# Patient Record
Sex: Female | Born: 1965 | Race: White | Hispanic: No | Marital: Married | Smoking: Never smoker
Health system: Southern US, Community
[De-identification: ages and names within clinical notes are randomized; demographics above are authoritative.]

## PROBLEM LIST (undated history)

## (undated) DIAGNOSIS — R740 Nonspecific elevation of levels of transaminase and lactic acid dehydrogenase [LDH]: Secondary | ICD-10-CM

## (undated) DIAGNOSIS — E785 Hyperlipidemia, unspecified: Secondary | ICD-10-CM

## (undated) DIAGNOSIS — I10 Essential (primary) hypertension: Secondary | ICD-10-CM

## (undated) DIAGNOSIS — F329 Major depressive disorder, single episode, unspecified: Secondary | ICD-10-CM

## (undated) DIAGNOSIS — G43009 Migraine without aura, not intractable, without status migrainosus: Secondary | ICD-10-CM

## (undated) DIAGNOSIS — F419 Anxiety disorder, unspecified: Secondary | ICD-10-CM

## (undated) DIAGNOSIS — F988 Other specified behavioral and emotional disorders with onset usually occurring in childhood and adolescence: Secondary | ICD-10-CM

## (undated) HISTORY — DX: Hyperlipidemia, unspecified: E78.5

## (undated) HISTORY — DX: Major depressive disorder, single episode, unspecified: F32.9

## (undated) HISTORY — DX: Nonspecific elevation of levels of transaminase and lactic acid dehydrogenase (ldh): R74.0

## (undated) HISTORY — DX: Essential (primary) hypertension: I10

## (undated) HISTORY — DX: Anxiety disorder, unspecified: F41.9

## (undated) HISTORY — DX: Migraine without aura, not intractable, without status migrainosus: G43.009

## (undated) HISTORY — DX: Other specified behavioral and emotional disorders with onset usually occurring in childhood and adolescence: F98.8

---

## 1993-04-18 DIAGNOSIS — Z9889 Other specified postprocedural states: Secondary | ICD-10-CM

## 1993-04-18 HISTORY — DX: Other specified postprocedural states: Z98.890

## 1993-04-18 HISTORY — PX: BREAST SURGERY: SHX581

## 1998-02-12 ENCOUNTER — Other Ambulatory Visit: Admission: RE | Admit: 1998-02-12 | Discharge: 1998-02-12 | Payer: Self-pay | Admitting: Obstetrics and Gynecology

## 1999-05-25 ENCOUNTER — Other Ambulatory Visit: Admission: RE | Admit: 1999-05-25 | Discharge: 1999-05-25 | Payer: Self-pay | Admitting: Obstetrics and Gynecology

## 2000-03-28 ENCOUNTER — Ambulatory Visit (HOSPITAL_COMMUNITY): Admission: RE | Admit: 2000-03-28 | Discharge: 2000-03-28 | Payer: Self-pay | Admitting: *Deleted

## 2000-03-28 ENCOUNTER — Encounter: Payer: Self-pay | Admitting: *Deleted

## 2000-09-12 ENCOUNTER — Other Ambulatory Visit: Admission: RE | Admit: 2000-09-12 | Discharge: 2000-09-12 | Payer: Self-pay | Admitting: Obstetrics and Gynecology

## 2001-03-29 ENCOUNTER — Emergency Department (HOSPITAL_COMMUNITY): Admission: EM | Admit: 2001-03-29 | Discharge: 2001-03-29 | Payer: Self-pay

## 2002-02-18 ENCOUNTER — Other Ambulatory Visit: Admission: RE | Admit: 2002-02-18 | Discharge: 2002-02-18 | Payer: Self-pay | Admitting: Obstetrics and Gynecology

## 2003-02-11 ENCOUNTER — Other Ambulatory Visit: Admission: RE | Admit: 2003-02-11 | Discharge: 2003-02-11 | Payer: Self-pay | Admitting: Obstetrics and Gynecology

## 2004-03-31 ENCOUNTER — Ambulatory Visit (HOSPITAL_COMMUNITY): Admission: RE | Admit: 2004-03-31 | Discharge: 2004-03-31 | Payer: Self-pay | Admitting: Gastroenterology

## 2005-03-23 ENCOUNTER — Encounter: Admission: RE | Admit: 2005-03-23 | Discharge: 2005-06-21 | Payer: Self-pay | Admitting: Internal Medicine

## 2007-12-21 ENCOUNTER — Ambulatory Visit: Payer: Self-pay | Admitting: Internal Medicine

## 2007-12-21 DIAGNOSIS — F988 Other specified behavioral and emotional disorders with onset usually occurring in childhood and adolescence: Secondary | ICD-10-CM | POA: Insufficient documentation

## 2007-12-21 DIAGNOSIS — F329 Major depressive disorder, single episode, unspecified: Secondary | ICD-10-CM | POA: Insufficient documentation

## 2007-12-21 DIAGNOSIS — F3289 Other specified depressive episodes: Secondary | ICD-10-CM

## 2007-12-21 DIAGNOSIS — I10 Essential (primary) hypertension: Secondary | ICD-10-CM

## 2007-12-21 HISTORY — DX: Major depressive disorder, single episode, unspecified: F32.9

## 2007-12-21 HISTORY — DX: Essential (primary) hypertension: I10

## 2007-12-21 HISTORY — DX: Other specified depressive episodes: F32.89

## 2007-12-21 HISTORY — DX: Other specified behavioral and emotional disorders with onset usually occurring in childhood and adolescence: F98.8

## 2007-12-24 ENCOUNTER — Encounter: Payer: Self-pay | Admitting: Internal Medicine

## 2008-04-25 ENCOUNTER — Telehealth (INDEPENDENT_AMBULATORY_CARE_PROVIDER_SITE_OTHER): Payer: Self-pay | Admitting: *Deleted

## 2008-07-07 ENCOUNTER — Telehealth (INDEPENDENT_AMBULATORY_CARE_PROVIDER_SITE_OTHER): Payer: Self-pay | Admitting: *Deleted

## 2008-07-30 ENCOUNTER — Ambulatory Visit: Payer: Self-pay | Admitting: Internal Medicine

## 2008-07-30 DIAGNOSIS — E785 Hyperlipidemia, unspecified: Secondary | ICD-10-CM | POA: Insufficient documentation

## 2008-07-30 HISTORY — DX: Hyperlipidemia, unspecified: E78.5

## 2008-10-14 ENCOUNTER — Encounter: Payer: Self-pay | Admitting: Internal Medicine

## 2008-10-16 ENCOUNTER — Ambulatory Visit: Payer: Self-pay | Admitting: Internal Medicine

## 2008-12-31 ENCOUNTER — Ambulatory Visit: Payer: Self-pay | Admitting: Internal Medicine

## 2008-12-31 DIAGNOSIS — G43009 Migraine without aura, not intractable, without status migrainosus: Secondary | ICD-10-CM | POA: Insufficient documentation

## 2008-12-31 HISTORY — DX: Migraine without aura, not intractable, without status migrainosus: G43.009

## 2009-05-19 LAB — HM MAMMOGRAPHY: HM Mammogram: NORMAL

## 2009-08-13 ENCOUNTER — Ambulatory Visit: Payer: Self-pay | Admitting: Internal Medicine

## 2009-08-13 DIAGNOSIS — R7401 Elevation of levels of liver transaminase levels: Secondary | ICD-10-CM

## 2009-08-13 DIAGNOSIS — R740 Nonspecific elevation of levels of transaminase and lactic acid dehydrogenase [LDH]: Secondary | ICD-10-CM

## 2009-08-13 HISTORY — DX: Elevation of levels of liver transaminase levels: R74.01

## 2009-08-14 LAB — CONVERTED CEMR LAB
ALT: 97 units/L — ABNORMAL HIGH (ref 0–35)
AST: 81 units/L — ABNORMAL HIGH (ref 0–37)
Albumin: 4 g/dL (ref 3.5–5.2)
Alkaline Phosphatase: 128 units/L — ABNORMAL HIGH (ref 39–117)
BUN: 5 mg/dL — ABNORMAL LOW (ref 6–23)
Basophils Absolute: 0.2 10*3/uL — ABNORMAL HIGH (ref 0.0–0.1)
Basophils Relative: 2.1 % (ref 0.0–3.0)
Bilirubin Urine: NEGATIVE
Bilirubin, Direct: 0.2 mg/dL (ref 0.0–0.3)
CO2: 30 meq/L (ref 19–32)
Calcium: 9.5 mg/dL (ref 8.4–10.5)
Chloride: 105 meq/L (ref 96–112)
Cholesterol: 212 mg/dL — ABNORMAL HIGH (ref 0–200)
Creatinine, Ser: 0.9 mg/dL (ref 0.4–1.2)
Direct LDL: 139 mg/dL
Eosinophils Absolute: 0.3 10*3/uL (ref 0.0–0.7)
Eosinophils Relative: 4.8 % (ref 0.0–5.0)
GFR calc non Af Amer: 71.84 mL/min (ref >60)
Glucose, Bld: 106 mg/dL — ABNORMAL HIGH (ref 70–99)
HCT: 39 % (ref 36.0–46.0)
HCV Ab: NEGATIVE
HDL: 47.5 mg/dL (ref >39.00)
Hemoglobin, Urine: NEGATIVE
Hemoglobin: 13.4 g/dL (ref 12.0–15.0)
Hep A IgM: NEGATIVE
Hep B C IgM: NEGATIVE
Hepatitis B Surface Ag: NEGATIVE
Ketones, ur: NEGATIVE mg/dL
Leukocytes, UA: NEGATIVE
Lymphocytes Relative: 27.8 % (ref 12.0–46.0)
Lymphs Abs: 2 10*3/uL (ref 0.7–4.0)
MCHC: 34.4 g/dL (ref 30.0–36.0)
MCV: 87.9 fL (ref 78.0–100.0)
Monocytes Absolute: 0.5 10*3/uL (ref 0.1–1.0)
Monocytes Relative: 6.9 % (ref 3.0–12.0)
Neutro Abs: 4.2 10*3/uL (ref 1.4–7.7)
Neutrophils Relative %: 58.4 % (ref 43.0–77.0)
Nitrite: NEGATIVE
Platelets: 229 10*3/uL (ref 150.0–400.0)
Potassium: 4.2 meq/L (ref 3.5–5.1)
RBC: 4.43 M/uL (ref 3.87–5.11)
RDW: 12.8 % (ref 11.5–14.6)
Sodium: 142 meq/L (ref 135–145)
Specific Gravity, Urine: 1.01 (ref 1.000–1.030)
TSH: 0.89 microintl units/mL (ref 0.35–5.50)
Total Bilirubin: 0.9 mg/dL (ref 0.3–1.2)
Total CHOL/HDL Ratio: 4
Total Protein, Urine: NEGATIVE mg/dL
Total Protein: 6.8 g/dL (ref 6.0–8.3)
Triglycerides: 157 mg/dL — ABNORMAL HIGH (ref 0.0–149.0)
Urine Glucose: NEGATIVE mg/dL
Urobilinogen, UA: 1 (ref 0.0–1.0)
VLDL: 31.4 mg/dL (ref 0.0–40.0)
WBC: 7.2 10*3/uL (ref 4.5–10.5)
pH: 6 (ref 5.0–8.0)

## 2009-08-19 ENCOUNTER — Encounter: Admission: RE | Admit: 2009-08-19 | Discharge: 2009-08-19 | Payer: Self-pay | Admitting: Internal Medicine

## 2009-09-11 ENCOUNTER — Telehealth: Payer: Self-pay | Admitting: Internal Medicine

## 2009-10-22 ENCOUNTER — Telehealth: Payer: Self-pay | Admitting: Internal Medicine

## 2009-12-16 ENCOUNTER — Telehealth: Payer: Self-pay | Admitting: Internal Medicine

## 2010-01-21 ENCOUNTER — Telehealth: Payer: Self-pay | Admitting: Internal Medicine

## 2010-01-26 ENCOUNTER — Telehealth: Payer: Self-pay | Admitting: Internal Medicine

## 2010-04-30 ENCOUNTER — Telehealth: Payer: Self-pay | Admitting: Internal Medicine

## 2010-05-09 ENCOUNTER — Encounter: Payer: Self-pay | Admitting: Internal Medicine

## 2010-05-20 NOTE — Progress Notes (Signed)
Summary: Adderall  Phone Note Call from Patient Call back at Home Phone [phone]   Caller: Patient Summary of Call: Pt called requesting refill of Adderall Initial call taken by: Margaret Pyle, CMA,  December 16, 2009 9:46 AM  Follow-up for Phone Call        Pt informed Via VM. Rx in cabinet for pt pick up Follow-up by: Margaret Pyle, CMA,  December 16, 2009 1:30 PM    New/Updated Medications: ADDERALL XR 30 MG XR24H-CAP (AMPHETAMINE-DEXTROAMPHETAMINE) 1 by mouth once daily - to fill Dec 16, 2009 Prescriptions: ADDERALL XR 30 MG XR24H-CAP (AMPHETAMINE-DEXTROAMPHETAMINE) 1 by mouth once daily - to fill Dec 16, 2009  #30 x 0   Entered and Authorized by:   Corwin Levins MD   Signed by:   Corwin Levins MD on 12/16/2009   Method used:   Print then Give to Patient   RxID:   [id number]  done hardcopy to LIM side B - dahlia  Corwin Levins MD  December 16, 2009 1:04 PM

## 2010-05-20 NOTE — Progress Notes (Signed)
Summary: Adderall/[facility] pt  Phone Note Call from Patient Call back at Home Phone [phone]   Caller: Patient Summary of Call: Pt called requesting refill of Adderall Initial call taken by: Margaret Pyle, CMA,  April 30, 2010 10:21 AM  Follow-up for Phone Call        irefilled x 1.  i printed Follow-up by: Minus Breeding MD,  April 30, 2010 12:13 PM  Additional Follow-up for Phone Call Additional follow up Details #1::        Pt informed via VM, Rx in cabinet for pt pick up Additional Follow-up by: Margaret Pyle, CMA,  April 30, 2010 1:32 PM    New/Updated Medications: ADDERALL XR 30 MG XR24H-CAP (AMPHETAMINE-DEXTROAMPHETAMINE) 1 by mouth once daily Prescriptions: ADDERALL XR 30 MG XR24H-CAP (AMPHETAMINE-DEXTROAMPHETAMINE) 1 by mouth once daily  #30 x 0   Entered and Authorized by:   Minus Breeding MD   Signed by:   Minus Breeding MD on 04/30/2010   Method used:   Print then Give to Patient   RxID:   [id number]

## 2010-05-20 NOTE — Progress Notes (Signed)
Summary: Adderall  Phone Note Call from Patient Call back at Home Phone [phone] Call back at Work Phone [phone]   Caller: Patient Summary of Call: pt called requesting refill of Adderall XR 30mg  Initial call taken by: Margaret Pyle, CMA,  Sep 11, 2009 8:47 AM  Follow-up for Phone Call        pt informed, Rx in cabinet for pt pick up Follow-up by: Margaret Pyle, CMA,  Sep 11, 2009 9:33 AM    New/Updated Medications: ADDERALL XR 30 MG XR24H-CAP (AMPHETAMINE-DEXTROAMPHETAMINE) 1 by mouth once daily - to fill Sep 12, 2009 Prescriptions: ADDERALL XR 30 MG XR24H-CAP (AMPHETAMINE-DEXTROAMPHETAMINE) 1 by mouth once daily - to fill Sep 12, 2009  #30 x 0   Entered and Authorized by:   Corwin Levins MD   Signed by:   Corwin Levins MD on 09/11/2009   Method used:   Print then Give to Patient   RxID:   [id number]  done hardcopy to LIM side B - dahlia Corwin Levins MD  Sep 11, 2009 9:24 AM

## 2010-05-20 NOTE — Progress Notes (Signed)
Summary: Adderall  Phone Note Call from Patient Call back at Home Phone [phone]   Caller: Patient Summary of Call: Pt called requesting refill of Adderall Initial call taken by: Margaret Pyle, CMA,  October 22, 2009 11:25 AM    New/Updated Medications: ADDERALL XR 30 MG XR24H-CAP (AMPHETAMINE-DEXTROAMPHETAMINE) 1 by mouth once daily - to fill October 22, 2009 Prescriptions: ADDERALL XR 30 MG XR24H-CAP (AMPHETAMINE-DEXTROAMPHETAMINE) 1 by mouth once daily - to fill October 22, 2009  #30 x 0   Entered and Authorized by:   Corwin Levins MD   Signed by:   Corwin Levins MD on 10/22/2009   Method used:   Print then Give to Patient   RxID:   [id number]  done hardcopy to LIM side B - dahlia Corwin Levins MD  October 22, 2009 1:20 PM   Pt informed via VM, Rx in cabinet for pt pick up Margaret Pyle, CMA  October 22, 2009 1:52 PM

## 2010-05-20 NOTE — Progress Notes (Signed)
Summary: Adderall  Phone Note Call from Patient Call back at Home Phone [phone]   Caller: Patient Summary of Call: Pt called requesting refill of Adderall Initial call taken by: Margaret Pyle, CMA,  January 21, 2010 9:29 AM  Follow-up for Phone Call        Pt informed via VM, Rx in cabinet for pt pick up Follow-up by: Margaret Pyle, CMA,  January 21, 2010 1:47 PM    New/Updated Medications: ADDERALL XR 30 MG XR24H-CAP (AMPHETAMINE-DEXTROAMPHETAMINE) 1 by mouth once daily - to fill Jan 21, 2010 Prescriptions: ADDERALL XR 30 MG XR24H-CAP (AMPHETAMINE-DEXTROAMPHETAMINE) 1 by mouth once daily - to fill Jan 21, 2010  #30 x 0   Entered and Authorized by:   Corwin Levins MD   Signed by:   Corwin Levins MD on 01/21/2010   Method used:   Print then Give to Patient   RxID:   [id number]  done hardcopy to LIM side B - dahlia  Corwin Levins MD  January 21, 2010 1:26 PM

## 2010-05-20 NOTE — Miscellaneous (Signed)
Summary: Orders Update  Clinical Lists Changes  Problems: Added new problem of TRANSAMINASES, SERUM, ELEVATED (ICD-790.4) Orders: Added new Referral order of Radiology Referral (Radiology) - Signed 

## 2010-05-20 NOTE — Assessment & Plan Note (Signed)
Summary: follow up to refill rx-lb   Vital Signs:  Patient profile:   45 year old female Height:      61.5 inches Weight:      167.31 pounds BMI:     31.21 O2 Sat:      97 % on Room air Temp:     98.1 degrees F oral Pulse rate:   78 / minute BP sitting:   110 / 72  (left arm) Cuff size:   regular  Vitals Entered ByZella Ball Ewing (August 13, 2009 9:28 AM)  O2 Flow:  Room air  Preventive Care Screening  Mammogram:    Date:  05/19/2009    Next Due:  05/2010    Results:  normal   CC: followup on medication refills/RE   CC:  followup on medication refills/RE.  History of Present Illness: overalld doing well, no complaints., Pt denies CP, sob, doe, wheezing, orthopnea, pnd, worsening LE edema, palps, dizziness or syncope   Pt denies new neuro symptoms such as headache, facial or extremity weakness   Problems Prior to Update: 1)  Transaminases, Serum, Elevated  (ICD-790.4) 2)  Common Migraine  (ICD-346.10) 3)  Preventive Health Care  (ICD-V70.0) 4)  Hyperlipidemia  (ICD-272.4) 5)  Add  (ICD-314.00) 6)  Hypertension  (ICD-401.9) 7)  Family History Breast Cancer 1st Degree Relative <50  (ICD-V16.3) 8)  Depression  (ICD-311)  Medications Prior to Update: 1)  Clonazepam 0.5 Mg Tabs (Clonazepam) .... Take 1 Per Day As Needed 2)  Adderall Xr 30 Mg Xr24h-Cap (Amphetamine-Dextroamphetamine) .Marland Kitchen.. 1 By Mouth Once Daily - To Fill Sept 11, 2010 3)  Wellbutrin Xl 300 Mg Xr24h-Tab (Bupropion Hcl) .Marland Kitchen.. 1 By Mouth Once Daily 4)  Sumatriptan Succinate 100 Mg Tabs (Sumatriptan Succinate) .Marland Kitchen.. 1 By Mouth Every Other Day As Needed  Current Medications (verified): 1)  Clonazepam 0.5 Mg Tabs (Clonazepam) .... Take 1 Per Day As Needed 2)  Adderall Xr 30 Mg Xr24h-Cap (Amphetamine-Dextroamphetamine) .Marland Kitchen.. 1 By Mouth Once Daily - To Fill Aug 13, 2009 3)  Wellbutrin Xl 300 Mg Xr24h-Tab (Bupropion Hcl) .Marland Kitchen.. 1 By Mouth Once Daily 4)  Sumatriptan Succinate 100 Mg Tabs (Sumatriptan Succinate) .Marland Kitchen.. 1 By  Mouth Every Other Day As Needed  Allergies (verified): No Known Drug Allergies  Past History:  Past Medical History: Last updated: 07/30/2008 Depression Hypertension ADD Hyperlipidemia  Past Surgical History: Last updated: 12/21/2007 Denies surgical history  Family History: Last updated: 12/21/2007 Family History of Arthritis -  mother, sister, aunt   Family History Breast cancer - mother, sister, several aunts Family History High cholesterol - father Family History Hypertension - father father with ? 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         all otherwise negative per pt -    Physical Exam  General:  alert and overweight-appearing.   Head:  normocephalic and atraumatic.   Eyes:  vision grossly intact, pupils equal, and pupils round.   Ears:  R ear normal and L ear  normal.   Nose:  no external deformity and no nasal discharge.   Mouth:  no gingival abnormalities and pharynx pink and moist.   Neck:  supple and no masses.   Lungs:  normal respiratory effort and normal breath sounds.   Heart:  normal rate and regular rhythm.   Abdomen:  soft, non-tender, and normal bowel sounds.   Msk:  no joint tenderness and no joint swelling.   Extremities:  no edema, no erythema  Neurologic:  cranial nerves II-XII intact and  strength normal in all extremities.   Skin:  color normal and no rashes.   Psych:  not depressed appearing and moderately anxious.     Impression & Recommendations:  Problem # 1:  Preventive Health Care (ICD-V70.0) Overall doing well, age appropriate education and counseling updated and referral for appropriate preventive services done unless declined, immunizations up to date or declined, diet counseling done if overweight, urged to quit smoking if smokes , most recent labs reviewed and current ordered if appropriate, ecg reviewed or declined (interpretation per ECG scanned in the EMR if done); information regarding Medicare Prevention requirements given if appropriate  Orders: TLB-BMP (Basic Metabolic Panel-BMET) (80048-METABOL) TLB-CBC Platelet - w/Differential (85025-CBCD) TLB-Hepatic/Liver Function Pnl (80076-HEPATIC) TLB-Lipid Panel (80061-LIPID) TLB-TSH (Thyroid Stimulating Hormone) (84443-TSH) TLB-Udip ONLY (81003-UDIP)  Problem # 2:  ADD (ICD-314.00) for med refills  Complete Medication List: 1)  Clonazepam 0.5 Mg Tabs (Clonazepam) .... Take 1 per day as needed 2)  Adderall Xr 30 Mg Xr24h-cap (Amphetamine-dextroamphetamine) .Marland Kitchen.. 1 by mouth once daily - to fill Aug 13, 2009 3)  Wellbutrin Xl 300 Mg Xr24h-tab (Bupropion hcl) .Marland Kitchen.. 1 by mouth once daily 4)  Sumatriptan Succinate 100 Mg Tabs (Sumatriptan succinate) .Marland Kitchen.. 1 by mouth every other day as needed  Patient Instructions: 1)  Continue all previous medications as before this visit  2)  Please go to the Lab in the basement for your blood and/or urine tests today  3)  Please schedule a follow-up appointment in 1 year or sooner if needed Prescriptions: SUMATRIPTAN SUCCINATE 100 MG TABS (SUMATRIPTAN SUCCINATE) 1 by mouth every other day as needed  #9 x 11   Entered and Authorized by:   Corwin Levins MD   Signed by:   Corwin Levins MD on 08/13/2009   Method used:   Print then Give to Patient   RxID:   [id number] ADDERALL  XR 30 MG XR24H-CAP (AMPHETAMINE-DEXTROAMPHETAMINE) 1 by mouth once daily - to fill Aug 13, 2009  #30 x 0   Entered and Authorized by:   Corwin Levins MD   Signed by:   Corwin Levins MD on 08/13/2009   Method used:   Print then Give to Patient   RxID:   [id number] WELLBUTRIN XL 300 MG XR24H-TAB (BUPROPION HCL) 1 by mouth once daily  #90 x 3   Entered and Authorized by:   Corwin Levins MD   Signed by:   Corwin Levins MD on 08/13/2009   Method used:   Print then Give to Patient   RxID:   [id number] CLONAZEPAM 0.5 MG TABS (CLONAZEPAM) Take 1 per day as needed  #30 x 5   Entered and Authorized by:   Corwin Levins MD   Signed by:   Corwin Levins MD on 08/13/2009   Method used:   Print then Give to Patient   RxID:   [id number]

## 2010-05-20 NOTE — Progress Notes (Signed)
Summary: medication refill  Phone Note Refill Request Message from:  Pharmacy on January 26, 2010 1:49 PM  Refills Requested: Medication #1:  CLONAZEPAM 0.5 MG TABS Take 1 per day as needed   Dosage confirmed as above?Dosage Confirmed   Last Refilled: 08/13/2009   Notes: [facility], [phone] Initial call taken by: Zella Ball Ewing CMA Duncan Dull),  January 26, 2010 1:50 PM    Prescriptions: CLONAZEPAM 0.5 MG TABS (CLONAZEPAM) Take 1 per day as needed  #30 x 5   Entered and Authorized by:   Corwin Levins MD   Signed by:   Corwin Levins MD on 01/26/2010   Method used:   Print then Give to Patient   RxID:   [id number]  done hardcopy to LIM side B - dahlia Corwin Levins MD  January 26, 2010 2:04 PM   Rx faxed to pharmacy Margaret Pyle, CMA  January 26, 2010 2:15 PM

## 2010-06-24 ENCOUNTER — Telehealth: Payer: Self-pay | Admitting: Internal Medicine

## 2010-06-29 NOTE — Progress Notes (Signed)
Summary: Adderall  Phone Note Call from Patient Call back at Work Phone [phone]   Caller: Patient Summary of Call: Pt called requesting refill of Adderall. Pt is due for CPX in April 2012 Initial call taken by: Margaret Pyle, CMA,  June 24, 2010 9:03 AM  Follow-up for Phone Call        done hardcopy to LIM side B - dahlia    please help pt make appt for cpx in april if not already done Follow-up by: Corwin Levins MD,  June 24, 2010 9:50 AM  Additional Follow-up for Phone Call Additional follow up Details #1::        Pt advised of Rx and CPX due via VM. Rx in cabinet for pt pick up Additional Follow-up by: Margaret Pyle, CMA,  June 24, 2010 10:01 AM    Prescriptions: ADDERALL XR 30 MG XR24H-CAP (AMPHETAMINE-DEXTROAMPHETAMINE) 1 by mouth once daily  #30 x 0   Entered and Authorized by:   Corwin Levins MD   Signed by:   Corwin Levins MD on 06/24/2010   Method used:   Print then Give to Patient   RxID:   [id number]  done hardcopy to LIM side B - dahlia Corwin Levins MD  June 24, 2010 9:50 AM

## 2010-08-06 ENCOUNTER — Other Ambulatory Visit: Payer: Self-pay

## 2010-08-06 MED ORDER — AMPHETAMINE-DEXTROAMPHET ER 30 MG PO CP24: 30.0000 mg | ORAL_CAPSULE | ORAL | Status: DC

## 2010-08-06 NOTE — Telephone Encounter (Signed)
Pt informed, Rx in cabinet for pt pick up  

## 2010-08-06 NOTE — Telephone Encounter (Signed)
Pt called requesting refill of Adderall, last written 06/24/2010. Pt due for OV this month.

## 2010-09-03 NOTE — Op Note (Signed)
NAMECONCEPCION, GILLOTT             ACCOUNT NO.:  [id number]   MEDICAL RECORD NO.:  [id number]          PATIENT TYPE:  AMB   LOCATION:  ENDO                         FACILITY:  [facility]   PHYSICIAN:  Danise Edge, M.D.   DATE OF BIRTH:  [birth year]   DATE OF PROCEDURE:  03/31/2004  DATE OF DISCHARGE:                                 OPERATIVE REPORT   PROCEDURE:  Screening colonoscopy.   PROCEDURE INDICATION:  Ms. Carrisa Keller is a 45 year old female, born  1966/02/24.  Ms. Newcombe father was diagnosed with colon cancer at  age 54-51.  Ms. Constantine is scheduled to undergo her first screening  colonoscopy with polypectomy to prevent colon cancer.   ENDOSCOPIST:  Danise Edge, M.D.   PREMEDICATION:  1.  Versed 10 mg.  2.  Demerol 100 mg.   DESCRIPTION OF PROCEDURE:  After obtaining informed consent, Ms. Southgate  was placed in the left lateral decubitus position.  I administered  intravenous Demerol and intravenous Versed to achieve conscious sedation for  the procedure.  The patient's blood pressure, oxygen saturation, and cardiac  rhythm were monitored throughout the procedure and documented in the medical  record.   Anal inspection and digital rectal exam were normal.  The Olympus adjustable  pediatric colonoscope was introduced into the rectum and advanced to the  cecum.  Colonic preparation for the exam today was excellent.   RECTUM:  Normal.  SIGMOID COLON AND DESCENDING COLON:  Normal.  SPLENIC FLEXURE:  Normal.  TRANSVERSE COLON:  Normal.  HEPATIC FLEXURE:  Normal.  ASCENDING COLON:  Normal.  CECUM AND ILEOCECAL VALVE:  Normal.   ASSESSMENT:  Normal screening proctocolonoscopy to the cecum.   RECOMMENDATIONS:  Virtual colonoscopy or optical colonoscopy in 5 years.      MJ/MEDQ  D:  03/31/2004  T:  03/31/2004  Job:  [id number]   cc:   Sherry A. Rosalio Macadamia, M.D.  [address]  [place]  [place] [address]  Fax: [phone]   Georgann Housekeeper, MD  [address]  [place]  [place] [address]  Fax: [phone]   Archer Asa, M.D.

## 2010-10-05 ENCOUNTER — Encounter: Payer: Self-pay | Admitting: Internal Medicine

## 2010-10-05 ENCOUNTER — Ambulatory Visit (INDEPENDENT_AMBULATORY_CARE_PROVIDER_SITE_OTHER): Payer: BC Managed Care – PPO | Admitting: Internal Medicine

## 2010-10-05 ENCOUNTER — Other Ambulatory Visit: Payer: Self-pay | Admitting: Internal Medicine

## 2010-10-05 ENCOUNTER — Other Ambulatory Visit (INDEPENDENT_AMBULATORY_CARE_PROVIDER_SITE_OTHER): Payer: BC Managed Care – PPO

## 2010-10-05 VITALS — BP 112/74 | HR 76 | Temp 98.7°F | Ht 62.0 in | Wt 154.5 lb

## 2010-10-05 DIAGNOSIS — Z0000 Encounter for general adult medical examination without abnormal findings: Secondary | ICD-10-CM

## 2010-10-05 DIAGNOSIS — E785 Hyperlipidemia, unspecified: Secondary | ICD-10-CM

## 2010-10-05 DIAGNOSIS — F411 Generalized anxiety disorder: Secondary | ICD-10-CM

## 2010-10-05 DIAGNOSIS — I10 Essential (primary) hypertension: Secondary | ICD-10-CM

## 2010-10-05 DIAGNOSIS — F419 Anxiety disorder, unspecified: Secondary | ICD-10-CM | POA: Insufficient documentation

## 2010-10-05 HISTORY — DX: Anxiety disorder, unspecified: F41.9

## 2010-10-05 LAB — CBC WITH DIFFERENTIAL/PLATELET
Basophils Relative: 0.3 % (ref 0.0–3.0)
Eosinophils Absolute: 0.1 10*3/uL (ref 0.0–0.7)
Eosinophils Relative: 1.7 % (ref 0.0–5.0)
HCT: 41.4 % (ref 36.0–46.0)
Lymphs Abs: 1.9 10*3/uL (ref 0.7–4.0)
MCHC: 34.5 g/dL (ref 30.0–36.0)
MCV: 89.2 fl (ref 78.0–100.0)
Monocytes Absolute: 0.4 10*3/uL (ref 0.1–1.0)
Platelets: 225 10*3/uL (ref 150.0–400.0)
RBC: 4.64 Mil/uL (ref 3.87–5.11)
WBC: 8 10*3/uL (ref 4.5–10.5)

## 2010-10-05 LAB — BASIC METABOLIC PANEL
BUN: 8 mg/dL (ref 6–23)
Chloride: 103 mEq/L (ref 96–112)
GFR: 107.87 mL/min (ref >60.00)
Potassium: 3.9 mEq/L (ref 3.5–5.1)

## 2010-10-05 LAB — LIPID PANEL
Cholesterol: 250 mg/dL — ABNORMAL HIGH (ref 0–200)
HDL: 50.6 mg/dL (ref >39.00)
Triglycerides: 184 mg/dL — ABNORMAL HIGH (ref 0.0–149.0)
VLDL: 36.8 mg/dL (ref 0.0–40.0)

## 2010-10-05 LAB — URINALYSIS, ROUTINE W REFLEX MICROSCOPIC
Bilirubin Urine: NEGATIVE
Ketones, ur: NEGATIVE
Total Protein, Urine: NEGATIVE
pH: 6.5 (ref 5.0–8.0)

## 2010-10-05 LAB — HEPATIC FUNCTION PANEL
AST: 29 U/L (ref 0–37)
Bilirubin, Direct: 0.2 mg/dL (ref 0.0–0.3)
Total Bilirubin: 0.9 mg/dL (ref 0.3–1.2)

## 2010-10-05 MED ORDER — BUPROPION HCL ER (XL) 150 MG PO TB24: 150.0000 mg | ORAL_TABLET | Freq: Every day | ORAL | Status: DC

## 2010-10-05 MED ORDER — AMPHETAMINE-DEXTROAMPHET ER 30 MG PO CP24: 30.0000 mg | ORAL_CAPSULE | Freq: Every day | ORAL | Status: DC

## 2010-10-05 MED ORDER — CLONAZEPAM 0.5 MG PO TABS: 0.5000 mg | ORAL_TABLET | Freq: Two times a day (BID) | ORAL | Status: DC | PRN

## 2010-10-05 MED ORDER — ESCITALOPRAM OXALATE 10 MG PO TABS: 10.0000 mg | ORAL_TABLET | Freq: Every day | ORAL | Status: DC

## 2010-10-05 MED ORDER — SUMATRIPTAN SUCCINATE 100 MG PO TABS: ORAL_TABLET | ORAL | Status: DC

## 2010-10-05 NOTE — Assessment & Plan Note (Signed)
stable overall by hx and exam, most recent data reviewed with pt, and pt to continue medical treatment as before  BP Readings from Last 3 Encounters:  10/05/10 112/74  08/13/09 110/72  12/31/08 102/86

## 2010-10-05 NOTE — Assessment & Plan Note (Signed)
D/w pt, declines statin, for lower chol diet 

## 2010-10-05 NOTE — Assessment & Plan Note (Signed)

## 2010-10-05 NOTE — Assessment & Plan Note (Signed)
Mild uncontrolled, for klonopin bid prn, wean the wellbutrin, and start lexapro 10 qd, consider incr to 20, consider psychiatry referral but declines

## 2010-10-05 NOTE — Patient Instructions (Signed)
Take all new medications as prescribed - the generic lexapro 10 mg per day Decrease the wellbutrin to 150 mg per day for 30 days, then stop Increase the klonopin to twice per day as needed Please call in 4 weeks if you feel you need the 20 mg lexapro for nerves, and depression Please go to LAB in the Basement for the blood and/or urine tests to be done today Please call the phone number [phone] (the [employer] System) for results of testing in 2-3 days;  When calling, simply dial the number, and when prompted enter the MRN number above (the Medical Record Number) and the # key, then the message should start. Please return in 1 year for your yearly visit, or sooner if needed, with Lab testing done 3-5 days before

## 2010-10-05 NOTE — Progress Notes (Signed)
Subjective:    Patient ID: Dawn Andrews, female    DOB: 1966/01/19, 45 y.o.   MRN: [id number]  HPI Here for wellness and f/u;  Overall doing ok;  Pt denies CP, worsening SOB, DOE, wheezing, orthopnea, PND, worsening LE edema, palpitations, dizziness or syncope.  Pt denies neurological change such as new Headache, facial or extremity weakness.  Pt denies polydipsia, polyuria, or low sugar symptoms. Pt states overall good compliance with treatment and medications, good tolerability, and trying to follow lower cholesterol diet.  Pt denies worsening depressive symptoms, suicidal ideation or panic. No fever, wt loss, night sweats, loss of appetite, or other constitutional symptoms.  Pt states good ability with ADL's, low fall risk, home safety reviewed and adequate, no significant changes in hearing or vision, and occasionally active with exercise.  No other new complaints, except ongoing anxiety despite current meds.   Past Medical History  Diagnosis Date  . ADD 12/21/2007  . COMMON MIGRAINE 12/31/2008  . DEPRESSION 12/21/2007  . HYPERLIPIDEMIA 07/30/2008  . HYPERTENSION 12/21/2007  . TRANSAMINASES, SERUM, ELEVATED 08/13/2009   No past surgical history on file.  reports that she has never smoked. She does not have any smokeless tobacco history on file. She reports that she drinks alcohol. Her drug history not on file. family history includes ADD / ADHD in her son; Arthritis in her mother, other, and sister; Cancer in her brother, father, mother, other, and sister; Hyperlipidemia in her father; and Hypertension in her father. No Known Allergies Current Outpatient Prescriptions on File Prior to Visit  Medication Sig Dispense Refill  . DISCONTD: amphetamine-dextroamphetamine (ADDERALL XR) 30 MG 24 hr capsule Take 30 mg by mouth daily.        Marland Kitchen DISCONTD: buPROPion (WELLBUTRIN XL) 300 MG 24 hr tablet Take 300 mg by mouth daily.        Marland Kitchen DISCONTD: clonazePAM (KLONOPIN) 0.5 MG tablet Take 0.5 mg by mouth  daily as needed.        Marland Kitchen DISCONTD: SUMAtriptan (IMITREX) 100 MG tablet Take 100 mg by mouth. 1 by mouth every other day as needed        Review of Systems Review of Systems  Constitutional: Negative for diaphoresis, activity change, appetite change and unexpected weight change.  HENT: Negative for hearing loss, ear pain, facial swelling, mouth sores and neck stiffness.   Eyes: Negative for pain, redness and visual disturbance.  Respiratory: Negative for shortness of breath and wheezing.   Cardiovascular: Negative for chest pain and palpitations.  Gastrointestinal: Negative for diarrhea, blood in stool, abdominal distention and rectal pain.  Genitourinary: Negative for hematuria, flank pain and decreased urine volume.  Musculoskeletal: Negative for myalgias and joint swelling.  Skin: Negative for color change and wound.  Neurological: Negative for syncope and numbness.  Hematological: Negative for adenopathy.  Psychiatric/Behavioral: Negative for hallucinations, self-injury, decreased concentration and agitation.      Objective:   Physical Exam BP 112/74  Pulse 76  Temp(Src) 98.7 F (37.1 C) (Oral)  Ht 5\' 2"  (1.575 m)  Wt 154 lb 8 oz (70.081 kg)  BMI 28.26 kg/m2  SpO2 98%  LMP 07/01/2010 Physical Exam  VS noted Constitutional: Pt is oriented to person, place, and time. Appears well-developed and well-nourished.  HENT:  Head: Normocephalic and atraumatic.  Right Ear: External ear normal.  Left Ear: External ear normal.  Nose: Nose normal.  Mouth/Throat: Oropharynx is clear and moist.  Eyes: Conjunctivae and EOM are normal. Pupils are equal, round, and reactive  to light.  Neck: Normal range of motion. Neck supple. No JVD present. No tracheal deviation present.  Cardiovascular: Normal rate, regular rhythm, normal heart sounds and intact distal pulses.   Pulmonary/Chest: Effort normal and breath sounds normal.  Abdominal: Soft. Bowel sounds are normal. There is no tenderness.    Musculoskeletal: Normal range of motion. Exhibits no edema.  Lymphadenopathy:  Has no cervical adenopathy.  Neurological: Pt is alert and oriented to person, place, and time. Pt has normal reflexes. No cranial nerve deficit.  Skin: Skin is warm and dry. No rash noted.  Psychiatric:  Has  normal mood and affect. Behavior is normal. 1+ nervous        Assessment & Plan:

## 2010-11-19 ENCOUNTER — Other Ambulatory Visit: Payer: Self-pay

## 2010-11-19 MED ORDER — ESCITALOPRAM OXALATE 20 MG PO TABS: 20.0000 mg | ORAL_TABLET | Freq: Every day | ORAL | Status: AC

## 2010-11-19 MED ORDER — AMPHETAMINE-DEXTROAMPHET ER 30 MG PO CP24: 30.0000 mg | ORAL_CAPSULE | Freq: Every day | ORAL | Status: DC

## 2010-11-19 NOTE — Telephone Encounter (Signed)
Pt called requesting refill of Adderall and increase of Lexapro to 20 mg, okay to fill increased medication?

## 2010-11-19 NOTE — Telephone Encounter (Signed)
Pt advised that increased Rx has been approved and sent to pharmacy, Adderall Rx available for pick and is in cabinet upfront.

## 2011-01-03 ENCOUNTER — Other Ambulatory Visit: Payer: Self-pay

## 2011-01-03 MED ORDER — AMPHETAMINE-DEXTROAMPHET ER 30 MG PO CP24: 30.0000 mg | ORAL_CAPSULE | Freq: Every day | ORAL | Status: DC

## 2011-01-03 NOTE — Telephone Encounter (Signed)
Called the patient left message to pickup prescription at front desk.

## 2011-01-03 NOTE — Telephone Encounter (Signed)
Done hardcopy to dahlia/LIM B  

## 2011-02-24 ENCOUNTER — Other Ambulatory Visit: Payer: Self-pay

## 2011-02-24 MED ORDER — AMPHETAMINE-DEXTROAMPHET ER 30 MG PO CP24: 30.0000 mg | ORAL_CAPSULE | Freq: Every day | ORAL | Status: DC

## 2011-02-24 NOTE — Telephone Encounter (Signed)
Done hardcopy to robin  

## 2011-02-24 NOTE — Telephone Encounter (Signed)
Called the patient left message that prescription requested is ready for pickup. Left message on work phone, cell number mailbox was full.

## 2011-05-11 ENCOUNTER — Other Ambulatory Visit: Payer: Self-pay

## 2011-05-11 MED ORDER — AMPHETAMINE-DEXTROAMPHET ER 30 MG PO CP24: 30.0000 mg | ORAL_CAPSULE | Freq: Every day | ORAL | Status: DC

## 2011-05-11 NOTE — Telephone Encounter (Signed)
Pt informed, Rx in cabinet for pt pick up  

## 2011-05-23 ENCOUNTER — Other Ambulatory Visit: Payer: Self-pay

## 2011-05-23 MED ORDER — CLONAZEPAM 0.5 MG PO TABS: 0.5000 mg | ORAL_TABLET | Freq: Two times a day (BID) | ORAL | Status: DC | PRN

## 2011-05-23 NOTE — Telephone Encounter (Signed)
Faxed hardcopy to pharmacy. 

## 2011-05-23 NOTE — Telephone Encounter (Signed)
Done hardcopy to robin  

## 2011-07-19 ENCOUNTER — Other Ambulatory Visit: Payer: Self-pay

## 2011-07-19 MED ORDER — AMPHETAMINE-DEXTROAMPHET ER 30 MG PO CP24: 30.0000 mg | ORAL_CAPSULE | Freq: Every day | ORAL | Status: DC

## 2011-07-19 NOTE — Telephone Encounter (Signed)
Patient called and informed by message that prescription requested is ready for pickup at front desk at convenience.

## 2011-07-19 NOTE — Telephone Encounter (Signed)
Done hardcopy to robin  

## 2011-08-30 ENCOUNTER — Other Ambulatory Visit: Payer: Self-pay

## 2011-08-30 MED ORDER — AMPHETAMINE-DEXTROAMPHET ER 30 MG PO CP24: 30.0000 mg | ORAL_CAPSULE | Freq: Every day | ORAL | Status: DC

## 2011-08-30 NOTE — Telephone Encounter (Signed)
Called left detailed message that prescription requested is ready for pickup at the front desk. 

## 2011-08-30 NOTE — Telephone Encounter (Signed)
Done hardcopy to robin  

## 2011-10-25 ENCOUNTER — Telehealth: Payer: Self-pay | Admitting: Internal Medicine

## 2011-10-25 MED ORDER — AMPHETAMINE-DEXTROAMPHET ER 30 MG PO CP24: 30.0000 mg | ORAL_CAPSULE | ORAL | Status: DC

## 2011-10-25 NOTE — Telephone Encounter (Signed)
Pt calling today 10/25/11 in regards to needs refill for her Adderall.  Says she has to call and leave message each time for her refills.  PLEASE CALL PT BACK AT [phone] WHEN SHE CAN COME BY OFFICE TO PICK THIS UP.

## 2011-10-25 NOTE — Telephone Encounter (Signed)
Called the patient left detailed message that prescription is ready for pickup and informed of MD's instructions on OV.

## 2011-10-25 NOTE — Telephone Encounter (Signed)
Last ov June 2012  Done hardcopy to robin, but needs OV for further refills

## 2011-10-27 ENCOUNTER — Other Ambulatory Visit: Payer: Self-pay

## 2011-10-27 MED ORDER — CLONAZEPAM 0.5 MG PO TABS: 0.5000 mg | ORAL_TABLET | Freq: Two times a day (BID) | ORAL | Status: DC | PRN

## 2011-10-27 NOTE — Telephone Encounter (Signed)
Done hardcopy to robin  

## 2011-10-28 NOTE — Telephone Encounter (Signed)
Faxed hardcopy to pharmacy. 

## 2011-11-01 ENCOUNTER — Ambulatory Visit (INDEPENDENT_AMBULATORY_CARE_PROVIDER_SITE_OTHER): Payer: BC Managed Care – PPO | Admitting: Internal Medicine

## 2011-11-01 ENCOUNTER — Other Ambulatory Visit (INDEPENDENT_AMBULATORY_CARE_PROVIDER_SITE_OTHER): Payer: BC Managed Care – PPO

## 2011-11-01 ENCOUNTER — Encounter: Payer: Self-pay | Admitting: Internal Medicine

## 2011-11-01 VITALS — BP 122/80 | HR 100 | Temp 98.5°F | Ht 62.0 in | Wt 152.5 lb

## 2011-11-01 DIAGNOSIS — R21 Rash and other nonspecific skin eruption: Secondary | ICD-10-CM

## 2011-11-01 DIAGNOSIS — Z0000 Encounter for general adult medical examination without abnormal findings: Secondary | ICD-10-CM

## 2011-11-01 LAB — CBC WITH DIFFERENTIAL/PLATELET
Basophils Absolute: 0.1 10*3/uL (ref 0.0–0.1)
Eosinophils Absolute: 0.8 10*3/uL — ABNORMAL HIGH (ref 0.0–0.7)
Eosinophils Relative: 9.1 % — ABNORMAL HIGH (ref 0.0–5.0)
HCT: 43.4 % (ref 36.0–46.0)
Lymphs Abs: 1.9 10*3/uL (ref 0.7–4.0)
MCHC: 33.8 g/dL (ref 30.0–36.0)
MCV: 88.5 fl (ref 78.0–100.0)
Monocytes Absolute: 0.4 10*3/uL (ref 0.1–1.0)
Platelets: 257 10*3/uL (ref 150.0–400.0)
RDW: 12.8 % (ref 11.5–14.6)

## 2011-11-01 LAB — URINALYSIS, ROUTINE W REFLEX MICROSCOPIC
Bilirubin Urine: NEGATIVE
Hgb urine dipstick: NEGATIVE
Leukocytes, UA: NEGATIVE
Nitrite: NEGATIVE
Total Protein, Urine: NEGATIVE

## 2011-11-01 MED ORDER — TRIAMCINOLONE ACETONIDE 0.1 % EX CREA: TOPICAL_CREAM | Freq: Two times a day (BID) | CUTANEOUS | Status: AC

## 2011-11-01 NOTE — Patient Instructions (Addendum)
Take all new medications as prescribed - the cream Continue all other medications as before Please go to LAB in the Basement for the blood and/or urine tests to be done today You will be contacted by phone if any changes need to be made immediately.  Otherwise, you will receive a letter about your results with an explanation. Please return in 1 year for your yearly visit, or sooner if needed, with Lab testing done 3-5 days before

## 2011-11-01 NOTE — Progress Notes (Signed)
Subjective:    Patient ID: Dawn Andrews, female    DOB: 1965-06-02, 46 y.o.   MRN: [id number]  HPI Here for wellness and f/u;  Overall doing ok;  Pt denies CP, worsening SOB, DOE, wheezing, orthopnea, PND, worsening LE edema, palpitations, dizziness or syncope.  Pt denies neurological change such as new Headache, facial or extremity weakness.  Pt denies polydipsia, polyuria, or low sugar symptoms. Pt states overall good compliance with treatment and medications, good tolerability, and trying to follow lower cholesterol diet.  Pt denies worsening depressive symptoms, suicidal ideation or panic. No fever, wt loss, night sweats, loss of appetite, or other constitutional symptoms.  Pt states good ability with ADL's, low fall risk, home safety reviewed and adequate, no significant changes in hearing or vision, and occasionally active with exercise.  Does have small rash to the arms and legs after working in the yard. Past Medical History  Diagnosis Date  . ADD 12/21/2007  . COMMON MIGRAINE 12/31/2008  . DEPRESSION 12/21/2007  . HYPERLIPIDEMIA 07/30/2008  . HYPERTENSION 12/21/2007  . TRANSAMINASES, SERUM, ELEVATED 08/13/2009  . Anxiety 10/05/2010   No past surgical history on file.  reports that she has never smoked. She does not have any smokeless tobacco history on file. She reports that she drinks alcohol. Her drug history not on file. family history includes ADD / ADHD in her son; Arthritis in her mother, other, and sister; Cancer in her brother, father, mother, other, and sister; Hyperlipidemia in her father; and Hypertension in her father. No Known Allergies Current Outpatient Prescriptions on File Prior to Visit  Medication Sig Dispense Refill  . amphetamine-dextroamphetamine (ADDERALL XR) 30 MG 24 hr capsule Take 1 capsule (30 mg total) by mouth daily.  30 capsule  0  . amphetamine-dextroamphetamine (ADDERALL XR) 30 MG 24 hr capsule Take 1 capsule (30 mg total) by mouth every morning.  30 capsule   0  . clonazePAM (KLONOPIN) 0.5 MG tablet Take 1 tablet (0.5 mg total) by mouth 2 (two) times daily as needed.  60 tablet  0  . atorvastatin (LIPITOR) 20 MG tablet Take 1 tablet (20 mg total) by mouth daily.  90 tablet  3  . buPROPion (WELLBUTRIN XL) 150 MG 24 hr tablet Take 1 tablet (150 mg total) by mouth daily.  30 tablet  0  . SUMAtriptan (IMITREX) 100 MG tablet 1 by mouth every other day as needed  10 tablet  5   Review of Systems Review of Systems  Constitutional: Negative for diaphoresis, activity change, appetite change and unexpected weight change.  HENT: Negative for hearing loss, ear pain, facial swelling, mouth sores and neck stiffness.   Eyes: Negative for pain, redness and visual disturbance.  Respiratory: Negative for shortness of breath and wheezing.   Cardiovascular: Negative for chest pain and palpitations.  Gastrointestinal: Negative for diarrhea, blood in stool, abdominal distention and rectal pain.  Genitourinary: Negative for hematuria, flank pain and decreased urine volume.  Musculoskeletal: Negative for myalgias and joint swelling.  Skin: Negative for color change and wound.  Neurological: Negative for syncope and numbness.  Hematological: Negative for adenopathy.  Psychiatric/Behavioral: Negative for hallucinations, self-injury, decreased concentration and agitation.      Objective:   Physical Exam BP 122/80  Pulse 100  Temp 98.5 F (36.9 C) (Oral)  Ht 5\' 2"  (1.575 m)  Wt 152 lb 8 oz (69.174 kg)  BMI 27.89 kg/m2  SpO2 96% Physical Exam  VS noted Constitutional: Pt is oriented to person, place,  and time. Appears well-developed and well-nourished.  HENT:  Head: Normocephalic and atraumatic.  Right Ear: External ear normal.  Left Ear: External ear normal.  Nose: Nose normal.  Mouth/Throat: Oropharynx is clear and moist.  Eyes: Conjunctivae and EOM are normal. Pupils are equal, round, and reactive to light.  Neck: Normal range of motion. Neck supple. No  JVD present. No tracheal deviation present.  Cardiovascular: Normal rate, regular rhythm, normal heart sounds and intact distal pulses.   Pulmonary/Chest: Effort normal and breath sounds normal.  Abdominal: Soft. Bowel sounds are normal. There is no tenderness.  Musculoskeletal: Normal range of motion. Exhibits no edema.  Lymphadenopathy:  Has no cervical adenopathy.  Neurological: Pt is alert and oriented to person, place, and time. Pt has normal reflexes. No cranial nerve deficit.  Skin: Skin is warm and dry. Small typical contact dermatitis like rash noted right arm and distal leg Psychiatric:  Has  normal mood and affect. Behavior is normal.     Assessment & Plan:

## 2011-11-02 ENCOUNTER — Other Ambulatory Visit: Payer: Self-pay | Admitting: Internal Medicine

## 2011-11-02 ENCOUNTER — Encounter: Payer: Self-pay | Admitting: Internal Medicine

## 2011-11-02 LAB — BASIC METABOLIC PANEL
GFR: 72.07 mL/min (ref >60.00)
Glucose, Bld: 92 mg/dL (ref 70–99)
Potassium: 3.8 mEq/L (ref 3.5–5.1)
Sodium: 137 mEq/L (ref 135–145)

## 2011-11-02 LAB — HEPATIC FUNCTION PANEL
AST: 28 U/L (ref 0–37)
Alkaline Phosphatase: 81 U/L (ref 39–117)
Total Bilirubin: 0.5 mg/dL (ref 0.3–1.2)

## 2011-11-02 LAB — LIPID PANEL: HDL: 48.4 mg/dL (ref >39.00)

## 2011-11-02 LAB — TSH: TSH: 0.97 u[IU]/mL (ref 0.35–5.50)

## 2011-11-02 MED ORDER — ATORVASTATIN CALCIUM 20 MG PO TABS: 20.0000 mg | ORAL_TABLET | Freq: Every day | ORAL | Status: AC

## 2011-11-13 ENCOUNTER — Encounter: Payer: Self-pay | Admitting: Internal Medicine

## 2011-11-13 DIAGNOSIS — R21 Rash and other nonspecific skin eruption: Secondary | ICD-10-CM | POA: Insufficient documentation

## 2011-11-13 NOTE — Assessment & Plan Note (Signed)

## 2011-11-13 NOTE — Assessment & Plan Note (Signed)
C/w contact dermatitis like, for kenalog cr prn,  to f/u any worsening symptoms or concerns

## 2011-11-24 ENCOUNTER — Other Ambulatory Visit: Payer: Self-pay

## 2011-11-24 MED ORDER — AMPHETAMINE-DEXTROAMPHET ER 30 MG PO CP24: 30.0000 mg | ORAL_CAPSULE | ORAL | Status: DC

## 2011-11-24 NOTE — Telephone Encounter (Signed)
Called the patient informed prescription requested is ready for pickup at the front desk. 

## 2011-11-24 NOTE — Telephone Encounter (Signed)
Done hardcopy to robin  

## 2011-12-02 ENCOUNTER — Other Ambulatory Visit: Payer: Self-pay

## 2011-12-02 MED ORDER — CLONAZEPAM 0.5 MG PO TABS: 0.5000 mg | ORAL_TABLET | Freq: Two times a day (BID) | ORAL | Status: DC | PRN

## 2011-12-02 NOTE — Telephone Encounter (Signed)
Faxed hardcopy to pharmacy. 

## 2011-12-02 NOTE — Telephone Encounter (Signed)
Done hardcopy to robin  

## 2012-01-24 ENCOUNTER — Telehealth: Payer: Self-pay | Admitting: Internal Medicine

## 2012-01-24 MED ORDER — AMPHETAMINE-DEXTROAMPHET ER 30 MG PO CP24: 30.0000 mg | ORAL_CAPSULE | ORAL | Status: DC

## 2012-01-24 NOTE — Telephone Encounter (Signed)
The patient called the triage line and is hoping to get a refill of Adderall.  Thanks!

## 2012-01-24 NOTE — Telephone Encounter (Signed)
Done hardcopy to robin  

## 2012-01-25 NOTE — Telephone Encounter (Signed)
Called the patient left detailed message that prescription requested is ready for pickup. 

## 2013-09-03 ENCOUNTER — Encounter: Payer: Self-pay | Admitting: Interventional Cardiology

## 2017-02-06 DIAGNOSIS — R6883 Chills (without fever): Secondary | ICD-10-CM | POA: Diagnosis not present

## 2017-02-06 DIAGNOSIS — H66003 Acute suppurative otitis media without spontaneous rupture of ear drum, bilateral: Secondary | ICD-10-CM | POA: Diagnosis not present

## 2017-02-21 DIAGNOSIS — R945 Abnormal results of liver function studies: Secondary | ICD-10-CM | POA: Diagnosis not present

## 2017-03-07 ENCOUNTER — Other Ambulatory Visit: Payer: Self-pay | Admitting: Internal Medicine

## 2017-03-07 DIAGNOSIS — R7989 Other specified abnormal findings of blood chemistry: Secondary | ICD-10-CM

## 2017-03-07 DIAGNOSIS — R945 Abnormal results of liver function studies: Secondary | ICD-10-CM | POA: Diagnosis not present

## 2017-03-07 DIAGNOSIS — Z23 Encounter for immunization: Secondary | ICD-10-CM | POA: Diagnosis not present

## 2017-03-14 ENCOUNTER — Ambulatory Visit
Admission: RE | Admit: 2017-03-14 | Discharge: 2017-03-14 | Disposition: A | Discharge status: Home / Self Care | Payer: 59 | Source: Ambulatory Visit | Attending: Internal Medicine | Admitting: Internal Medicine

## 2017-03-14 DIAGNOSIS — R7989 Other specified abnormal findings of blood chemistry: Secondary | ICD-10-CM

## 2017-03-14 DIAGNOSIS — K7689 Other specified diseases of liver: Secondary | ICD-10-CM | POA: Diagnosis not present

## 2017-03-14 DIAGNOSIS — R945 Abnormal results of liver function studies: Principal | ICD-10-CM

## 2017-04-04 DIAGNOSIS — Z1231 Encounter for screening mammogram for malignant neoplasm of breast: Secondary | ICD-10-CM | POA: Diagnosis not present

## 2017-04-04 DIAGNOSIS — Z01419 Encounter for gynecological examination (general) (routine) without abnormal findings: Secondary | ICD-10-CM | POA: Diagnosis not present

## 2017-05-08 DIAGNOSIS — Z0000 Encounter for general adult medical examination without abnormal findings: Secondary | ICD-10-CM | POA: Diagnosis not present

## 2017-05-08 DIAGNOSIS — E7800 Pure hypercholesterolemia, unspecified: Secondary | ICD-10-CM | POA: Diagnosis not present

## 2017-06-19 DIAGNOSIS — E782 Mixed hyperlipidemia: Secondary | ICD-10-CM | POA: Diagnosis not present

## 2017-08-23 DIAGNOSIS — Z800 Family history of malignant neoplasm of digestive organs: Secondary | ICD-10-CM | POA: Diagnosis not present

## 2017-11-21 DIAGNOSIS — Z79899 Other long term (current) drug therapy: Secondary | ICD-10-CM | POA: Diagnosis not present

## 2017-12-14 DIAGNOSIS — E782 Mixed hyperlipidemia: Secondary | ICD-10-CM | POA: Diagnosis not present

## 2017-12-14 DIAGNOSIS — I10 Essential (primary) hypertension: Secondary | ICD-10-CM | POA: Diagnosis not present

## 2018-06-15 IMAGING — US US ABDOMEN LIMITED
1 series · 14 of 25 positions shown · non-contrast
Comparison: Ultrasound 08/19/2009.

CLINICAL DATA: Elevated LFTs.

EXAM:
ULTRASOUND ABDOMEN LIMITED RIGHT UPPER QUADRANT

[Series 1: us abdomen limited · 0.23mm/px · 14 of 42 slices shown]
[im 1/42]
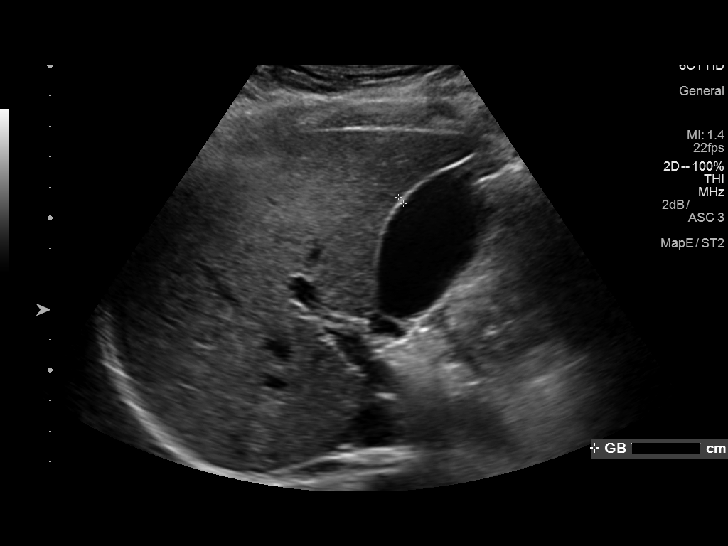
[im 4/42]
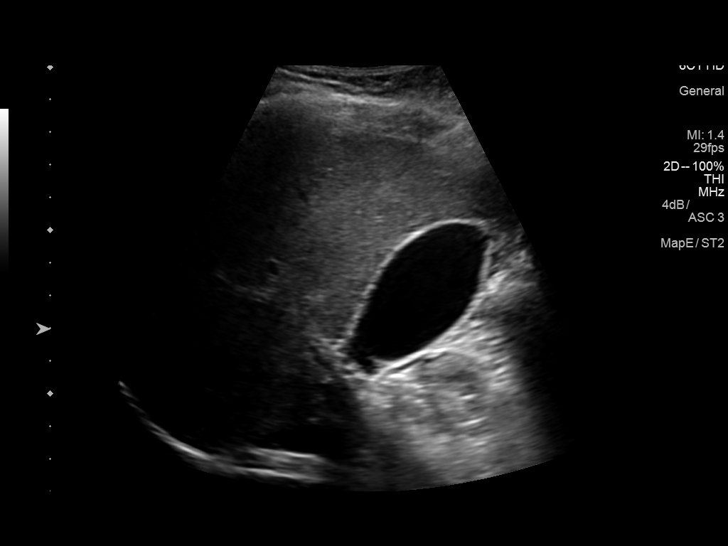
[im 7/42]
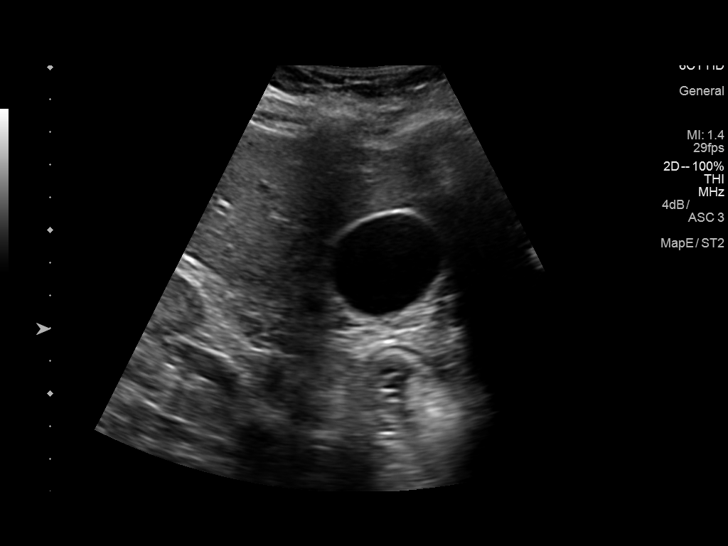
[im 11/42]
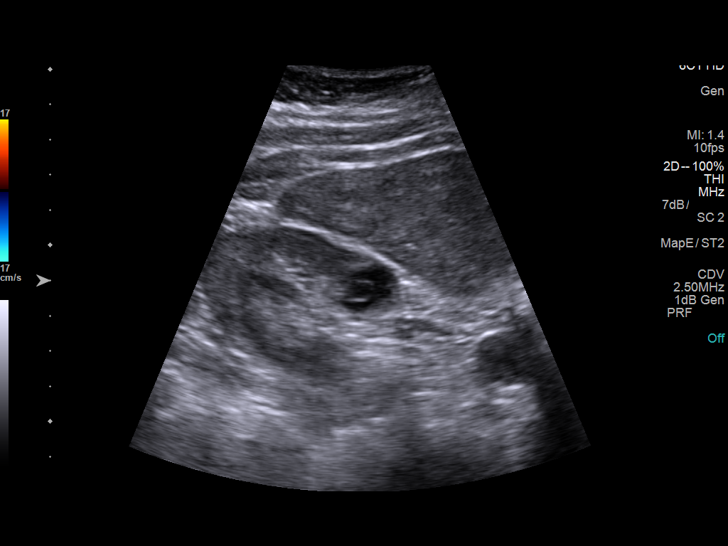
[im 14/42]
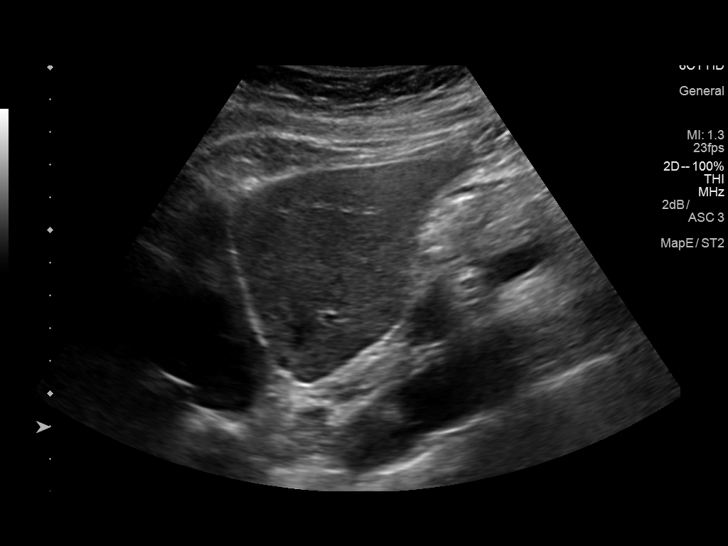
[im 16/42]
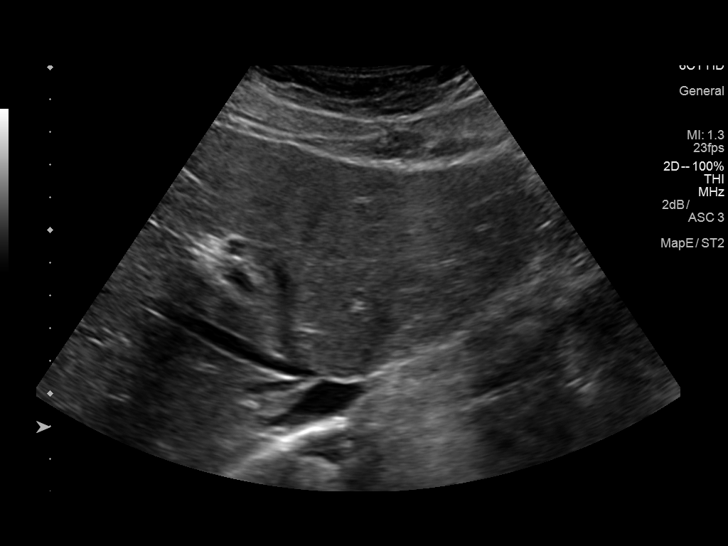
[im 19/42]
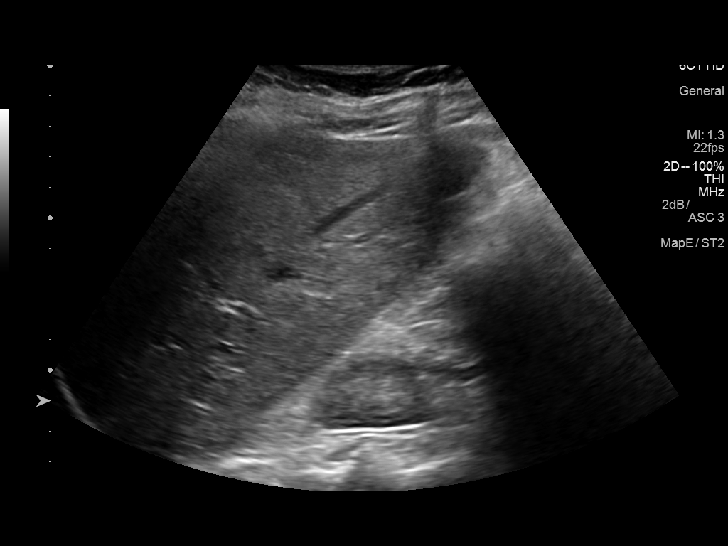
[im 23/42]
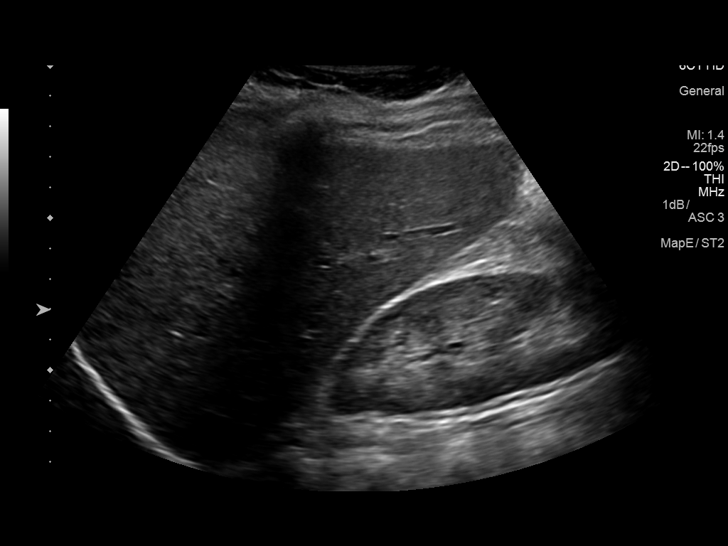
[im 26/42]
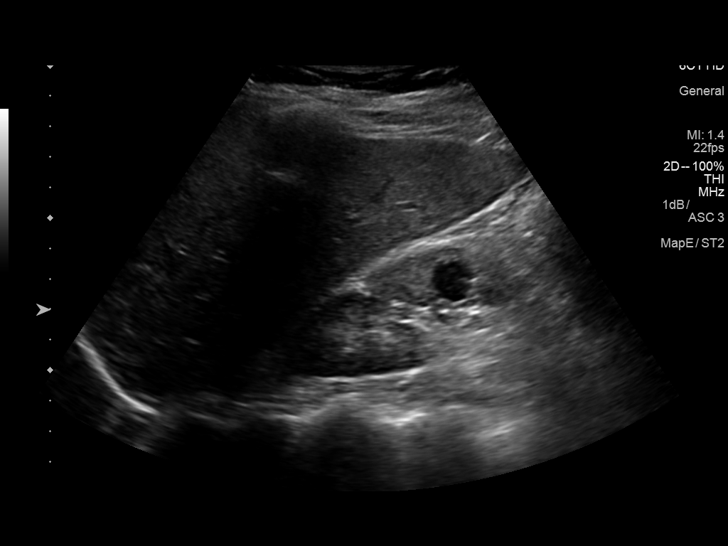
[im 28/42]
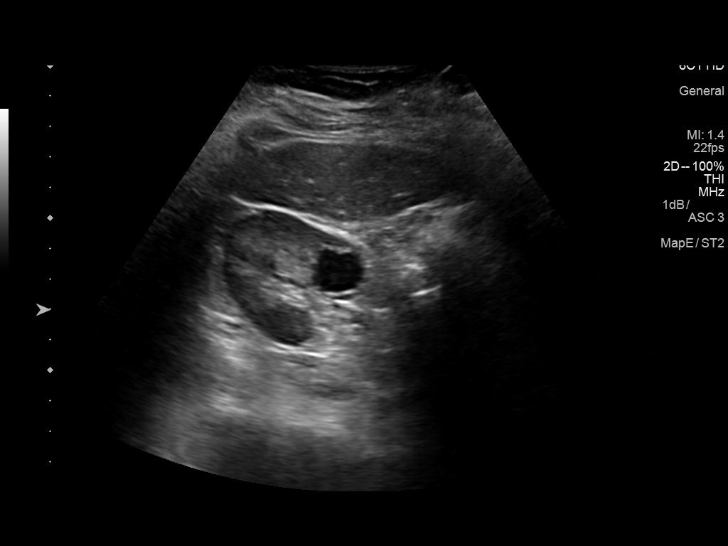
[im 31/42]
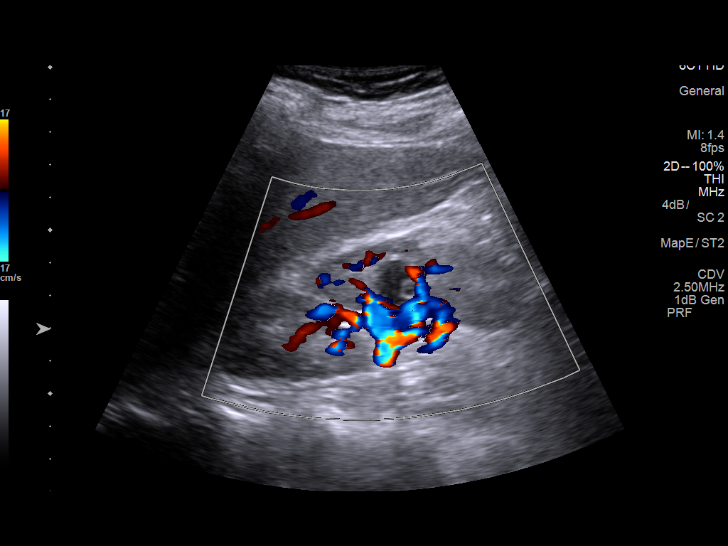
[im 35/42]
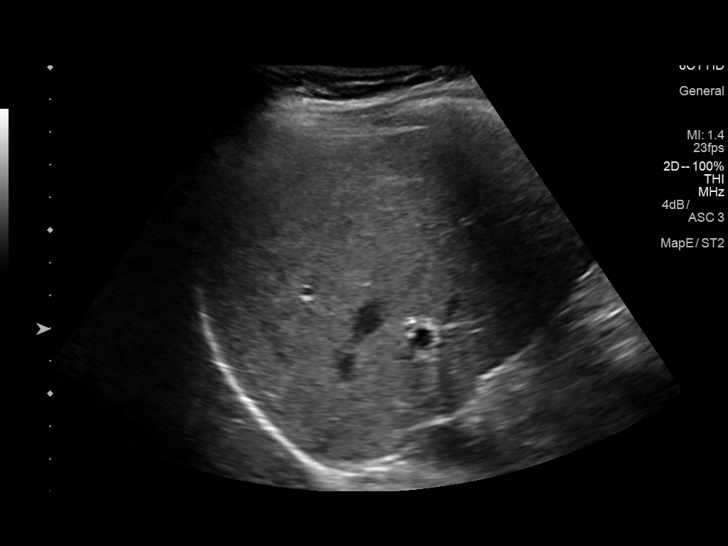
[im 38/42]
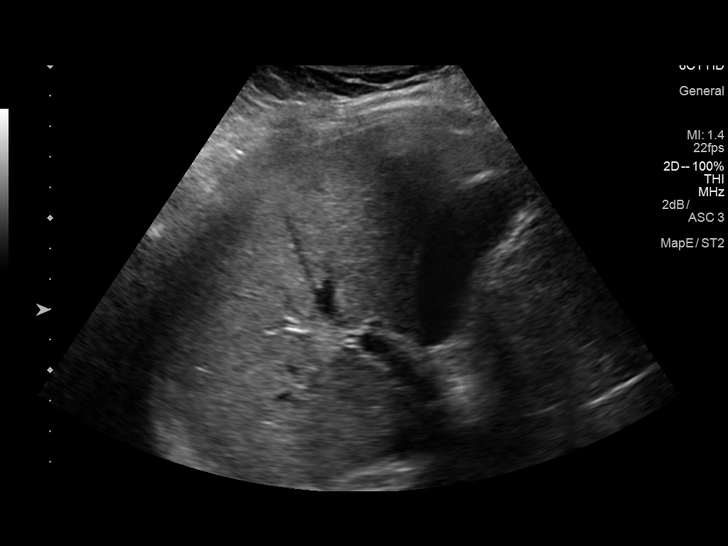
[im 42/42]
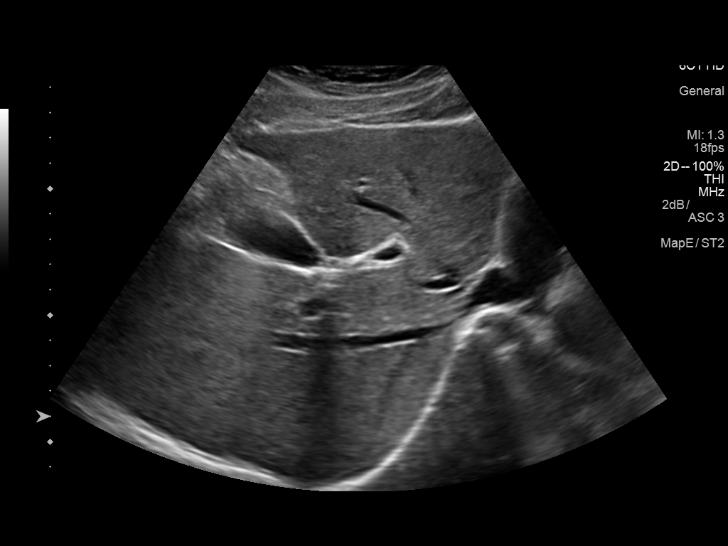

[14 of 25 positions shown; findings below may reference images not displayed]

FINDINGS: Gallbladder:

No gallstones or wall thickening visualized. No sonographic Murphy
sign noted by sonographer.

Common bile duct:

Diameter: 2.3 mm

Liver:

Increased echogenicity consistent fatty infiltration and/or
hepatocellular disease. Portal vein is patent on color Doppler
imaging with normal direction of blood flow towards the liver.

1.4 cm cyst with thin septation noted in the right kidney, this is
most likely benign. Increased echogenicity of the right renal
pyramids cannot be excluded .
IMPRESSION: 1. Increased hepatic echogenicity consistent fatty infiltration
and/or hepatocellular disease.

2. Increased echogenicity right renal pyramids cannot be excluded.
This can be seen from multiple causes including drug therapy and
medullary sponge kidney.

## 2019-05-30 ENCOUNTER — Other Ambulatory Visit: Payer: Self-pay

## 2019-05-30 ENCOUNTER — Ambulatory Visit (INDEPENDENT_AMBULATORY_CARE_PROVIDER_SITE_OTHER): Payer: 59 | Admitting: Psychiatry

## 2019-05-30 ENCOUNTER — Encounter: Payer: Self-pay | Admitting: Psychiatry

## 2019-05-30 DIAGNOSIS — F330 Major depressive disorder, recurrent, mild: Secondary | ICD-10-CM
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
## 2019-06-10 ENCOUNTER — Ambulatory Visit (INDEPENDENT_AMBULATORY_CARE_PROVIDER_SITE_OTHER): Payer: 59 | Admitting: Psychiatry

## 2019-06-10 ENCOUNTER — Other Ambulatory Visit: Payer: Self-pay

## 2019-06-10 ENCOUNTER — Encounter: Payer: Self-pay | Admitting: Psychiatry

## 2019-06-10 DIAGNOSIS — F330 Major depressive disorder, recurrent, mild: Secondary | ICD-10-CM
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
## 2019-06-24 ENCOUNTER — Other Ambulatory Visit: Payer: Self-pay

## 2019-06-24 ENCOUNTER — Ambulatory Visit (INDEPENDENT_AMBULATORY_CARE_PROVIDER_SITE_OTHER): Payer: 59 | Admitting: Psychiatry

## 2019-06-24 ENCOUNTER — Encounter: Payer: Self-pay | Admitting: Psychiatry

## 2019-06-24 DIAGNOSIS — F330 Major depressive disorder, recurrent, mild: Secondary | ICD-10-CM
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
## 2019-07-08 ENCOUNTER — Encounter: Payer: Self-pay | Admitting: Psychiatry

## 2019-07-08 ENCOUNTER — Other Ambulatory Visit: Payer: Self-pay

## 2019-07-08 ENCOUNTER — Ambulatory Visit (INDEPENDENT_AMBULATORY_CARE_PROVIDER_SITE_OTHER): Payer: 59 | Admitting: Psychiatry

## 2019-07-08 DIAGNOSIS — F330 Major depressive disorder, recurrent, mild: Secondary | ICD-10-CM
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
## 2019-07-22 ENCOUNTER — Encounter: Payer: Self-pay | Admitting: Psychiatry

## 2019-07-22 ENCOUNTER — Other Ambulatory Visit: Payer: Self-pay

## 2019-07-22 ENCOUNTER — Ambulatory Visit (INDEPENDENT_AMBULATORY_CARE_PROVIDER_SITE_OTHER): Payer: 59 | Admitting: Psychiatry

## 2019-07-22 DIAGNOSIS — F330 Major depressive disorder, recurrent, mild: Secondary | ICD-10-CM | POA: Diagnosis not present
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
## 2019-08-05 ENCOUNTER — Other Ambulatory Visit: Payer: Self-pay

## 2019-08-05 ENCOUNTER — Encounter: Payer: Self-pay | Admitting: Psychiatry

## 2019-08-05 ENCOUNTER — Ambulatory Visit (INDEPENDENT_AMBULATORY_CARE_PROVIDER_SITE_OTHER): Payer: 59 | Admitting: Psychiatry

## 2019-08-05 DIAGNOSIS — F330 Major depressive disorder, recurrent, mild: Secondary | ICD-10-CM
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
## 2019-08-19 ENCOUNTER — Encounter: Payer: Self-pay | Admitting: Psychiatry

## 2019-08-19 ENCOUNTER — Ambulatory Visit (INDEPENDENT_AMBULATORY_CARE_PROVIDER_SITE_OTHER): Payer: 59 | Admitting: Psychiatry

## 2019-08-19 ENCOUNTER — Other Ambulatory Visit: Payer: Self-pay

## 2019-08-19 DIAGNOSIS — F330 Major depressive disorder, recurrent, mild: Secondary | ICD-10-CM
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
## 2019-09-02 ENCOUNTER — Ambulatory Visit (INDEPENDENT_AMBULATORY_CARE_PROVIDER_SITE_OTHER): Payer: 59 | Admitting: Psychiatry

## 2019-09-02 ENCOUNTER — Encounter: Payer: Self-pay | Admitting: Psychiatry

## 2019-09-02 ENCOUNTER — Other Ambulatory Visit: Payer: Self-pay

## 2019-09-02 DIAGNOSIS — F330 Major depressive disorder, recurrent, mild: Secondary | ICD-10-CM | POA: Diagnosis not present
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
## 2019-09-17 ENCOUNTER — Ambulatory Visit: Payer: 59 | Admitting: Psychiatry

## 2019-10-02 ENCOUNTER — Other Ambulatory Visit: Payer: Self-pay

## 2019-10-02 ENCOUNTER — Ambulatory Visit (INDEPENDENT_AMBULATORY_CARE_PROVIDER_SITE_OTHER): Payer: 59 | Admitting: Psychiatry

## 2019-10-02 ENCOUNTER — Encounter: Payer: Self-pay | Admitting: Psychiatry

## 2019-10-02 DIAGNOSIS — F330 Major depressive disorder, recurrent, mild: Secondary | ICD-10-CM
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
## 2019-10-16 ENCOUNTER — Ambulatory Visit: Payer: 59 | Admitting: Psychiatry

## 2019-10-24 ENCOUNTER — Ambulatory Visit: Payer: 59 | Admitting: Psychiatry

## 2019-11-20 ENCOUNTER — Ambulatory Visit: Payer: 59 | Admitting: Psychiatry

## 2019-12-11 ENCOUNTER — Encounter: Payer: Self-pay | Admitting: Psychiatry

## 2019-12-11 ENCOUNTER — Ambulatory Visit (INDEPENDENT_AMBULATORY_CARE_PROVIDER_SITE_OTHER): Payer: 59 | Admitting: Psychiatry

## 2019-12-11 ENCOUNTER — Other Ambulatory Visit: Payer: Self-pay

## 2019-12-11 DIAGNOSIS — F330 Major depressive disorder, recurrent, mild: Secondary | ICD-10-CM | POA: Diagnosis not present

## 2019-12-11 NOTE — Progress Notes (Signed)
      [facility] Counselor/Therapist Progress Note  Patient ID: Dawn Andrews, MRN: [id number],    Date: 12/11/2019  Time Spent: 50 minutes   Treatment Type: Individual Therapy  Reported Symptoms: anxious, sad  Mental Status Exam:  Appearance:   Well Groomed     Behavior:  Appropriate  Motor:  Normal  Speech/Language:   Clear and Coherent  Affect:  Appropriate  Mood:  anxious and sad  Thought process:  normal  Thought content:    WNL  Sensory/Perceptual disturbances:    WNL  Orientation:  oriented to person, place, time/date and situation  Attention:  Good  Concentration:  Good  Memory:  WNL  Fund of knowledge:   Good  Insight:    Good  Judgment:   Good  Impulse Control:  Good   Risk Assessment: Danger to Self:  No Self-injurious Behavior: No Danger to Others: No Duty to Warn:no Physical Aggression / Violence:No  Access to Firearms a concern: No  Gang Involvement:No   Subjective: "My big dog attacked my Yorkie.  I thought he was killed but he survived."  The client stated that this caused her a lot of anxiety and tears.  Previous to that she states, "I have been crying off and on every day around any minor event."  Today I used eye-movement with the client focusing on her dog since that is the most recent event that caused her to cry and be anxious.  Her negative cognition is, "I will feel powerless or helpless and overwhelmed."  She feels anxiety in her chest.  Her subjective units of distress is a 7.  As the client processed using eye-movement she noticed a decrease in the amount of anxiety she felt.  She talked about her husband telling her that her prayers were "stupid" about her dog.  She also gets treated as if everyone has to protect her.  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  As I continued to process with the client I asked her "can you take care of yourself?"  She replied affirmatively.  I discussed with her her personality style as  an INFJ.  I pointed out that she is very intuitive which she agreed.  I also explained to her that when she ignores her intuition things usually do not go well.  She agreed with that.  I encouraged her to begin to pay attention to her intuition and figure out what it is that.  She agreed.  By the end of the session the client's positive cognition was, "I can trust my judgment.".  Her subjective units of distress was a 0.  Interventions: Assertiveness/Communication, Motivational Interviewing, Solution-Oriented/Positive Psychology, [employer] Desensitization and Reprocessing (EMDR) and Insight-Oriented  Diagnosis:   ICD-10-CM   1. Major depressive disorder, recurrent episode, mild (HCC)  F33.0     Plan: Mood independent behavior, positive self talk, self-care, boundaries, assertiveness.  Gelene Mink Jaskarn Schweer, [facility]

## 2020-01-07 ENCOUNTER — Ambulatory Visit: Payer: 59 | Admitting: Psychiatry

## 2020-02-12 ENCOUNTER — Encounter: Payer: Self-pay | Admitting: Psychiatry

## 2020-02-12 ENCOUNTER — Ambulatory Visit (INDEPENDENT_AMBULATORY_CARE_PROVIDER_SITE_OTHER): Payer: 59 | Admitting: Psychiatry

## 2020-02-12 ENCOUNTER — Other Ambulatory Visit: Payer: Self-pay

## 2020-02-12 DIAGNOSIS — F330 Major depressive disorder, recurrent, mild: Secondary | ICD-10-CM
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
## 2020-02-26 ENCOUNTER — Ambulatory Visit (INDEPENDENT_AMBULATORY_CARE_PROVIDER_SITE_OTHER): Payer: 59 | Admitting: Psychiatry

## 2020-02-26 ENCOUNTER — Other Ambulatory Visit: Payer: Self-pay

## 2020-02-26 ENCOUNTER — Encounter: Payer: Self-pay | Admitting: Psychiatry

## 2020-02-26 DIAGNOSIS — F330 Major depressive disorder, recurrent, mild: Secondary | ICD-10-CM

## 2020-02-26 NOTE — Progress Notes (Signed)
      [facility] Counselor/Therapist Progress Note  Patient ID: Dawn Andrews, MRN: [id number],    Date: 02/26/2020  Time Spent: 50 minutes   Treatment Type: Individual Therapy  Reported Symptoms: anxious, sad, irritated  Mental Status Exam:  Appearance:   Casual     Behavior:  Appropriate  Motor:  Normal  Speech/Language:   Clear and Coherent  Affect:  Appropriate  Mood:  anxious, irritable and sad  Thought process:  normal  Thought content:    WNL  Sensory/Perceptual disturbances:    WNL  Orientation:  oriented to person, place, time/date and situation  Attention:  Good  Concentration:  Good  Memory:  WNL  Fund of knowledge:   Good  Insight:    Good  Judgment:   Good  Impulse Control:  Good   Risk Assessment: Danger to Self:  No Self-injurious Behavior: No Danger to Others: No Duty to Warn:no Physical Aggression / Violence:No  Access to Firearms a concern: No  Gang Involvement:No   Subjective: The client reports that she has done better with her anxiety.  Using the visual with Jesus and her parents has been a helpful strategy for her getting up in the morning.  It only happens about 3 mornings a week but she does still awaken with anxiety.  She has been working more in her office with her husband.  She is not feeling that she is stuck at home unable to work.  Today she was much brighter than she has been in the past.  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  She expressed frustration at how some of their clients take advantage of them by not paying her bill.  Today it seems that it stresses her less but she does seem to be more irritated. She reflected more on what was happening politically in the culture.  She is distressed by the inconsistency in the current economic policy.  She feels that inflation is going to negatively impact so many people this coming winter.  This has caused her to approach things in  a more spiritual manner than she has in the past.  She finds great comfort in it.  She is working towards increasing her exercise.  She is journaling periodically.  She is starting to spend some time with friends.  She feels some of the depression she has had is lifted some.  Use the bilateral stimulation hand paddles with the client as she talked through these issues.  Her anxiety decreased from the subjective units of distress of 5 to less than 3.  Interventions: Assertiveness/Communication, Motivational Interviewing, Solution-Oriented/Positive Psychology, [employer] Desensitization and Reprocessing (EMDR) and Insight-Oriented  Diagnosis:   ICD-10-CM   1. Major depressive disorder, recurrent episode, mild (HCC)  F33.0     Plan: Mood independent behavior, visualization practices, journaling, exercise, positive self talk, assertiveness, boundaries.  Dawn Andrews, [facility]

## 2020-03-11 ENCOUNTER — Encounter: Payer: Self-pay | Admitting: Psychiatry

## 2020-03-11 ENCOUNTER — Ambulatory Visit (INDEPENDENT_AMBULATORY_CARE_PROVIDER_SITE_OTHER): Payer: 59 | Admitting: Psychiatry

## 2020-03-11 ENCOUNTER — Other Ambulatory Visit: Payer: Self-pay

## 2020-03-11 DIAGNOSIS — F330 Major depressive disorder, recurrent, mild: Secondary | ICD-10-CM | POA: Diagnosis not present
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
## 2020-03-17 ENCOUNTER — Encounter: Payer: Self-pay | Admitting: Physician Assistant

## 2020-03-17 ENCOUNTER — Ambulatory Visit (INDEPENDENT_AMBULATORY_CARE_PROVIDER_SITE_OTHER): Payer: 59 | Admitting: Physician Assistant

## 2020-03-17 ENCOUNTER — Other Ambulatory Visit: Payer: Self-pay

## 2020-03-17 VITALS — BP 148/88 | HR 82 | Ht 61.0 in | Wt 140.0 lb

## 2020-03-17 DIAGNOSIS — F331 Major depressive disorder, recurrent, moderate: Secondary | ICD-10-CM

## 2020-03-17 DIAGNOSIS — F429 Obsessive-compulsive disorder, unspecified: Secondary | ICD-10-CM

## 2020-03-17 DIAGNOSIS — F5089 Other specified eating disorder: Secondary | ICD-10-CM

## 2020-03-17 DIAGNOSIS — G4700 Insomnia, unspecified: Secondary | ICD-10-CM

## 2020-03-17 DIAGNOSIS — F411 Generalized anxiety disorder: Secondary | ICD-10-CM

## 2020-03-17 DIAGNOSIS — F4310 Post-traumatic stress disorder, unspecified: Secondary | ICD-10-CM

## 2020-03-17 MED ORDER — ALPRAZOLAM 1 MG PO TABS: 1.0000 mg | ORAL_TABLET | Freq: Two times a day (BID) | ORAL | 1 refills | Status: DC | PRN

## 2020-03-17 MED ORDER — ESZOPICLONE 3 MG PO TABS: 3.0000 mg | ORAL_TABLET | Freq: Every evening | ORAL | 1 refills | Status: DC | PRN

## 2020-03-17 MED ORDER — FLUVOXAMINE MALEATE 100 MG PO TABS: ORAL_TABLET | ORAL | 1 refills | Status: DC

## 2020-03-17 MED ORDER — BUPROPION HCL ER (XL) 300 MG PO TB24: 300.0000 mg | ORAL_TABLET | Freq: Every day | ORAL | 1 refills | Status: DC
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
## 2020-04-23 ENCOUNTER — Ambulatory Visit: Payer: 59 | Admitting: Psychiatry

## 2020-04-30 ENCOUNTER — Encounter: Payer: Self-pay | Admitting: Physician Assistant

## 2020-04-30 ENCOUNTER — Other Ambulatory Visit: Payer: Self-pay

## 2020-04-30 ENCOUNTER — Ambulatory Visit (INDEPENDENT_AMBULATORY_CARE_PROVIDER_SITE_OTHER): Payer: 59 | Admitting: Physician Assistant

## 2020-04-30 DIAGNOSIS — F411 Generalized anxiety disorder: Secondary | ICD-10-CM | POA: Diagnosis not present

## 2020-04-30 DIAGNOSIS — F3341 Major depressive disorder, recurrent, in partial remission: Secondary | ICD-10-CM | POA: Diagnosis not present

## 2020-04-30 DIAGNOSIS — F429 Obsessive-compulsive disorder, unspecified: Secondary | ICD-10-CM | POA: Diagnosis not present

## 2020-04-30 DIAGNOSIS — F4310 Post-traumatic stress disorder, unspecified: Secondary | ICD-10-CM

## 2020-04-30 DIAGNOSIS — G4700 Insomnia, unspecified: Secondary | ICD-10-CM

## 2020-04-30 MED ORDER — FLUVOXAMINE MALEATE 100 MG PO TABS: 100.0000 mg | ORAL_TABLET | Freq: Every day | ORAL | 1 refills | Status: DC

## 2020-04-30 MED ORDER — ALPRAZOLAM 1 MG PO TABS: 1.0000 mg | ORAL_TABLET | Freq: Two times a day (BID) | ORAL | 2 refills | Status: DC | PRN

## 2020-04-30 MED ORDER — ESZOPICLONE 3 MG PO TABS: 3.0000 mg | ORAL_TABLET | Freq: Every evening | ORAL | 2 refills | Status: DC | PRN

## 2020-04-30 NOTE — Progress Notes (Signed)
[facility] Med Check  Patient ID: Dawn Andrews,  MRN: [id number]  PCP: Georgann Housekeeper, MD  Date of Evaluation: 04/30/2020 Time spent:20 minutes  Chief Complaint:  Chief Complaint    Anxiety; Depression; Insomnia      HISTORY/CURRENT STATUS: HPI For routine med check.  At LOV 6 weeks ago, started Luvox. "I feel a lot better! Around 80%!" Obsessive thoughts are much better. Is able to enjoy things more. Energy/movitation are good. Not isolating. Not crying easily. No SI/HI.  Anxiety is much better treated after increasing the dose of Xanax and d/c Ativan. Feels much less anxious, the Xanax helps prevent it too, if she is able to know when the anxiety will be triggered.  Sleeps well with Lunesta. No reports of nightmares.  Patient denies increased energy with decreased need for sleep, no increased talkativeness, no racing thoughts, no impulsivity or risky behaviors, no increased spending, no increased libido, no grandiosity, no increased irritability or anger, no paranoia, and no hallucinations.  Denies dizziness, syncope, seizures, numbness, tingling, tremor, tics, unsteady gait, slurred speech, confusion. Denies muscle or joint pain, stiffness, or dystonia.  Individual Medical History/ Review of Systems: Changes? :No    Past medications for mental health diagnoses include: Trazodone, Restoril, Tranxene, Lithium (only one pill), Tegretol, Ambien, Wellbutrin, Xanax, Ativan, Lunesta, Prazosin  Allergies: Patient has no known allergies.  Current Medications:  Current Outpatient Medications:  .  buPROPion (WELLBUTRIN XL) 300 MG 24 hr tablet, Take 1 tablet (300 mg total) by mouth daily., Disp: 90 tablet, Rfl: 1 .  perindopril (ACEON) 4 MG tablet, Take by mouth., Disp: , Rfl:  .  prazosin (MINIPRESS) 1 MG capsule, Take 1 mg by mouth at bedtime., Disp: , Rfl:  .  rosuvastatin (CRESTOR) 10 MG tablet, Take by mouth., Disp: , Rfl:  .  UNABLE TO FIND, SEED probiotic, Disp: , Rfl:   .  ALPRAZolam (XANAX) 1 MG tablet, Take 1 tablet (1 mg total) by mouth 2 (two) times daily as needed for anxiety., Disp: 60 tablet, Rfl: 2 .  atorvastatin (LIPITOR) 20 MG tablet, Take 1 tablet (20 mg total) by mouth daily., Disp: 90 tablet, Rfl: 3 .  Eszopiclone 3 MG TABS, Take 1 tablet (3 mg total) by mouth at bedtime as needed., Disp: 30 tablet, Rfl: 2 .  fluvoxaMINE (LUVOX) 100 MG tablet, Take 1 tablet (100 mg total) by mouth at bedtime., Disp: 90 tablet, Rfl: 1 Medication Side Effects: none  Family Medical/ Social History: Changes? No  MENTAL HEALTH EXAM:  There were no vitals taken for this visit.There is no height or weight on file to calculate BMI.  General Appearance: Casual, Neat and Well Groomed  Eye Contact:  Good  Speech:  Clear and Coherent and Normal Rate  Volume:  Normal  Mood:  Euthymic  Affect:  Appropriate  Thought Process:  Goal Directed and Descriptions of Associations: Intact  Orientation:  Full (Time, Place, and Person)  Thought Content: Logical   Suicidal Thoughts:  No  Homicidal Thoughts:  No  Memory:  WNL  Judgement:  Good  Insight:  Good  Psychomotor Activity:  Normal  Concentration:  Concentration: Good  Recall:  Good  Fund of Knowledge: Good  Language: Good  Assets:  Desire for Improvement  ADL's:  Intact  Cognition: WNL  Prognosis:  Good    DIAGNOSES:    ICD-10-CM   1. Recurrent major depressive disorder, in partial remission (HCC)  F33.41   2. PTSD (post-traumatic stress disorder)  F43.10  3. Generalized anxiety disorder  F41.1   4. Obsessive-compulsive disorder, unspecified type  F42.9   5. Insomnia, unspecified type  G47.00     Receiving Psychotherapy: Yes With Sherron Monday, [facility]   RECOMMENDATIONS:  PDMP reviewed.  I provided 20 minutes of face to face time during this encounter, discussing her response to the medication.  She is doing great!  No changes in medication at this time. Continue Xanax 1 mg, 1 p.o. twice daily as  needed. Continue Wellbutrin XL 300 mg, 1 p.o. daily. Continue Lunesta 3 mg, 1 p.o. nightly as needed sleep. Continue Luvox 100 mg, 1 p.o. nightly. Continue prazosin 1 mg, 1 p.o. nightly Continue counseling with Sherron Monday, [facility]. Return in 3 months.  [name], PA-C

## 2020-05-07 ENCOUNTER — Ambulatory Visit: Payer: 59 | Admitting: Psychiatry

## 2020-05-21 ENCOUNTER — Ambulatory Visit: Payer: 59 | Admitting: Psychiatry

## 2020-06-04 ENCOUNTER — Other Ambulatory Visit: Payer: Self-pay

## 2020-06-04 ENCOUNTER — Encounter: Payer: Self-pay | Admitting: Psychiatry

## 2020-06-04 ENCOUNTER — Ambulatory Visit (INDEPENDENT_AMBULATORY_CARE_PROVIDER_SITE_OTHER): Payer: 59 | Admitting: Psychiatry

## 2020-06-04 DIAGNOSIS — F330 Major depressive disorder, recurrent, mild: Secondary | ICD-10-CM | POA: Diagnosis not present

## 2020-06-04 NOTE — Progress Notes (Signed)
      [facility] Counselor/Therapist Progress Note  Patient ID: Dawn Andrews, MRN: [id number],    Date: 06/04/2020  Time Spent: 50 minutes   Treatment Type: Individual Therapy  Reported Symptoms: sadness, anxiety  Mental Status Exam:  Appearance:   Casual and Well Groomed     Behavior:  Appropriate  Motor:  Normal  Speech/Language:   Clear and Coherent  Affect:  Appropriate  Mood:  anxious and sad  Thought process:  normal  Thought content:    WNL  Sensory/Perceptual disturbances:    WNL  Orientation:  oriented to person, place, time/date and situation  Attention:  Good  Concentration:  Good  Memory:  WNL  Fund of knowledge:   Good  Insight:    Good  Judgment:   Good  Impulse Control:  Good   Risk Assessment: Danger to Self:  No Self-injurious Behavior: No Danger to Others: No Duty to Warn:no Physical Aggression / Violence:No  Access to Firearms a concern: No  Gang Involvement:No   Subjective: The client has stepped out and 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  It is a full-time position and well paid.  The client finds that she likes it quite a bit.  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  She loves the fact that she is able to work from home.  Her interaction with the people in the firm has been very positive.  Her overall mood has significantly improved.  She states that she has mild episodes of anxiety but has found that mindful prayer has been helpful.  She still finds herself periodically sad but has been able to change her mood by engaging in other activities. The client states when her meds changed to Luvox and Wellbutrin she felt significantly improved.  It is amazing to see how much brighter she is.  Today we discussed what she can do to keep moving in a positive direction.  She is working towards getting back into regular exercise.  She is also decided that she wants to return to church.  She was raised in the  [facility] and has not attended in years.  There is a [employer] between her house and her husband's firm that she wants to attend.  I also connected the client with a local women's Bible study called Bible study fellowship or BSF.  The client states she will follow-up with this because she would like relationships with other women that are likeminded.  Interventions: Assertiveness/Communication, Mindfulness Meditation, Motivational Interviewing, Solution-Oriented/Positive Psychology and Insight-Oriented  Diagnosis:   ICD-10-CM   1. Major depressive disorder, recurrent episode, mild (HCC)  F33.0     Plan: Mindful prayer, mood independent behavior, positive self talk, self-care, assertiveness, boundaries, exercise, Bible study, church attendance.  Gelene Mink Shea Swalley, [facility]

## 2020-06-18 ENCOUNTER — Ambulatory Visit: Payer: 59 | Admitting: Psychiatry

## 2020-07-09 ENCOUNTER — Other Ambulatory Visit: Payer: Self-pay

## 2020-07-09 ENCOUNTER — Encounter: Payer: Self-pay | Admitting: Psychiatry

## 2020-07-09 ENCOUNTER — Ambulatory Visit: Payer: 59 | Admitting: Psychiatry

## 2020-07-09 ENCOUNTER — Ambulatory Visit (INDEPENDENT_AMBULATORY_CARE_PROVIDER_SITE_OTHER): Payer: 59 | Admitting: Psychiatry

## 2020-07-09 DIAGNOSIS — F330 Major depressive disorder, recurrent, mild: Secondary | ICD-10-CM | POA: Diagnosis not present

## 2020-07-09 NOTE — Progress Notes (Signed)
      [facility] Counselor/Therapist Progress Note  Patient ID: Dawn Andrews, MRN: [id number],    Date: 07/09/2020  Time Spent: 45 minutes   Treatment Type: Individual Therapy  Reported Symptoms: mild sadness  Mental Status Exam:  Appearance:   Well Groomed     Behavior:  Appropriate  Motor:  Normal  Speech/Language:   Clear and Coherent  Affect:  Appropriate  Mood:  sad  Thought process:  normal  Thought content:    WNL  Sensory/Perceptual disturbances:    WNL  Orientation:  oriented to person, place, time/date and situation  Attention:  Good  Concentration:  Good  Memory:  WNL  Fund of knowledge:   Good  Insight:    Good  Judgment:   Good  Impulse Control:  Good   Risk Assessment: Danger to Self:  No Self-injurious Behavior: No Danger to Others: No Duty to Warn:no Physical Aggression / Violence:No  Access to Firearms a concern: No  Gang Involvement:No   Subjective: The client's new job is going well.  She finds that working full-time from home is a good fit for her.  She feels that she has good interaction with her bosses and they are responding well to her.  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  Overall she finds that she has very little sadness and almost no anxiety.  She is bright and motivated.  She feels that she is able to get her work done.  In fact sometimes she works longer because she enjoys the work.  This is a huge change for the client.  Today she did not have anything in particular to work on.  I discussed with the client that I would be retiring at the end of April.  The client was disappointed but understood and was supportive.  She will continue to follow-up with Dawn Overly, PA-C.  She feels she has done the work she is needed to do.  If she does need to see someone she will talk with Dawn Andrews.  She will follow-up one more time in April.  Interventions: Motivational Interviewing, Solution-Oriented/Positive Psychology and  Insight-Oriented  Diagnosis:   ICD-10-CM   1. Major depressive disorder, recurrent episode, mild (HCC)  F33.0     Plan: Mood independent behavior, positive self talk, self-care, assertiveness, boundaries.  Dawn Andrews, [facility]

## 2020-08-05 ENCOUNTER — Other Ambulatory Visit: Payer: Self-pay

## 2020-08-05 ENCOUNTER — Encounter: Payer: Self-pay | Admitting: Physician Assistant

## 2020-08-05 ENCOUNTER — Ambulatory Visit (INDEPENDENT_AMBULATORY_CARE_PROVIDER_SITE_OTHER): Payer: 59 | Admitting: Physician Assistant

## 2020-08-05 ENCOUNTER — Other Ambulatory Visit: Payer: Self-pay | Admitting: Physician Assistant

## 2020-08-05 DIAGNOSIS — F411 Generalized anxiety disorder: Secondary | ICD-10-CM | POA: Diagnosis not present

## 2020-08-05 DIAGNOSIS — F4310 Post-traumatic stress disorder, unspecified: Secondary | ICD-10-CM

## 2020-08-05 DIAGNOSIS — G4700 Insomnia, unspecified: Secondary | ICD-10-CM

## 2020-08-05 DIAGNOSIS — F3342 Major depressive disorder, recurrent, in full remission: Secondary | ICD-10-CM | POA: Diagnosis not present

## 2020-08-05 DIAGNOSIS — F429 Obsessive-compulsive disorder, unspecified: Secondary | ICD-10-CM | POA: Diagnosis not present

## 2020-08-05 MED ORDER — FLUVOXAMINE MALEATE 100 MG PO TABS: 100.0000 mg | ORAL_TABLET | Freq: Every day | ORAL | 1 refills | Status: DC

## 2020-08-05 MED ORDER — ESZOPICLONE 3 MG PO TABS: 3.0000 mg | ORAL_TABLET | Freq: Every evening | ORAL | 2 refills | Status: DC | PRN

## 2020-08-05 MED ORDER — BUPROPION HCL ER (XL) 300 MG PO TB24: 300.0000 mg | ORAL_TABLET | Freq: Every day | ORAL | 1 refills | Status: DC

## 2020-08-05 MED ORDER — ALPRAZOLAM 1 MG PO TABS: 1.0000 mg | ORAL_TABLET | Freq: Two times a day (BID) | ORAL | 2 refills | Status: DC | PRN

## 2020-08-05 NOTE — Progress Notes (Signed)
[facility]  Patient ID: Dawn Andrews,  MRN: [id number]  PCP: Georgann Housekeeper, MD  Date of Evaluation: 08/08/2020 Time spent:20 minutes  Chief Complaint:  Chief Complaint    Anxiety; Depression; Follow-up      HISTORY/CURRENT STATUS: HPI for routine 31-month med check.  Dawn Andrews is doing great!  Says she feels better this past 4 months or so, then she has in a very long time.  She is able to enjoy things.  Energy and motivation are good.  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  She does not cry easily.  Does not isolate.  She sleeps well most of the time although she does need Lunesta.  No complaints of nightmares.  Denies suicidal or homicidal thoughts.  She is sad though because Fred May, [facility] is retiring.  He has been her counselor for at least 10 years.  She is very grateful for all of his help but will miss him.  He will be suggesting other therapist who would be a good fit.  Anxiety is very well treated.  The Luvox has greatly helped the ruminating thoughts and even though she has generalized anxiety and does need the Xanax, she is much better than 5 or 6 months ago.  Denies dizziness, syncope, seizures, numbness, tingling, tremor, tics, unsteady gait, slurred speech, confusion.  Denies muscle or joint pain, stiffness, or dystonia.  Individual Medical History/ Review of Systems: Changes? :No   Past medications for mental health diagnoses include: Trazodone, Restoril, Tranxene, Lithium (only one pill), Tegretol, Ambien, Wellbutrin, Xanax, Ativan, Lunesta, Prazosin  Allergies: Patient has no known allergies.  Current Medications:  Current Outpatient Medications:  .  perindopril (ACEON) 4 MG tablet, Take by mouth., Disp: , Rfl:  .  prazosin (MINIPRESS) 1 MG capsule, Take 1 mg by mouth at bedtime., Disp: , Rfl:  .  rosuvastatin (CRESTOR) 10 MG tablet, Take by mouth., Disp: , Rfl:  .  UNABLE TO  FIND, SEED probiotic, Disp: , Rfl:  .  ALPRAZolam (XANAX) 1 MG tablet, Take 1 tablet (1 mg total) by mouth 2 (two) times daily as needed for anxiety., Disp: 60 tablet, Rfl: 2 .  atorvastatin (LIPITOR) 20 MG tablet, Take 1 tablet (20 mg total) by mouth daily., Disp: 90 tablet, Rfl: 3 .  buPROPion (WELLBUTRIN XL) 300 MG 24 hr tablet, Take 1 tablet (300 mg total) by mouth daily., Disp: 90 tablet, Rfl: 1 .  Eszopiclone 3 MG TABS, Take 1 tablet (3 mg total) by mouth at bedtime as needed., Disp: 30 tablet, Rfl: 2 .  fluvoxaMINE (LUVOX) 100 MG tablet, Take 1 tablet (100 mg total) by mouth at bedtime., Disp: 90 tablet, Rfl: 1 Medication Side Effects: none  Family Medical/ Social History: Changes?no  MENTAL HEALTH EXAM:  There were no vitals taken for this visit.There is no height or weight on file to calculate BMI.  General Appearance: Casual, Neat and Well Groomed  Eye Contact:  Good  Speech:  Clear and Coherent and Normal Rate  Volume:  Normal  Mood:  Euthymic  Affect:  Congruent  Thought Process:  Goal Directed and Descriptions of Associations: Intact  Orientation:  Full (Time, Place, and Person)  Thought Content: Logical   Suicidal Thoughts:  No  Homicidal Thoughts:  No  Memory:  WNL  Judgement:  Good  Insight:  Good  Psychomotor Activity:  Normal  Concentration:  Concentration: Good  Recall:  [place] of Knowledge: Good  Language: Good  Assets:  Desire for Improvement  ADL's:  Intact  Cognition: WNL  Prognosis:  Good    DIAGNOSES:    ICD-10-CM   1. Major depression, recurrent, full remission (HCC)  F33.42   2. Obsessive-compulsive disorder, unspecified type  F42.9   3. PTSD (post-traumatic stress disorder)  F43.10   4. Generalized anxiety disorder  F41.1   5. Insomnia, unspecified type  G47.00     Receiving Psychotherapy: Yes with Sherron Monday, [facility]   RECOMMENDATIONS:  PDMP reviewed. I provided 20 minutes of face-to-face time during this encounter, including time spent  before and after the visit and records review and charting. She is doing great so no medication changes are necessary. I know it will be difficult to change counselors after so many years. I am sure Merlyn Albert will recommend someone that will be able to continue where he and [place] have left off. Continue Xanax 1 mg, 1 p.o. twice daily as needed. Continue Wellbutrin XL 300 mg, 1 p.o. daily. Continue Lunesta 3 mg, 1 p.o. nightly as needed sleep. Continue Luvox 100 mg, 1 p.o. nightly. Continue prazosin 1 mg, 1 p.o. nightly. She will get established with a new therapist soon. Return in 3 months.  [name], PA-C

## 2020-08-06 ENCOUNTER — Ambulatory Visit (INDEPENDENT_AMBULATORY_CARE_PROVIDER_SITE_OTHER): Payer: 59 | Admitting: Psychiatry

## 2020-08-06 ENCOUNTER — Encounter: Payer: Self-pay | Admitting: Psychiatry

## 2020-08-06 DIAGNOSIS — F330 Major depressive disorder, recurrent, mild: Secondary | ICD-10-CM

## 2020-08-06 NOTE — Progress Notes (Signed)
      [facility] Counselor/Therapist Progress Note  Patient ID: Dawn Andrews, MRN: [id number],    Date: 08/06/2020  Time Spent: 45 minutes   Treatment Type: Individual Therapy  Reported Symptoms: sad  Mental Status Exam:  Appearance:   Well Groomed     Behavior:  Appropriate  Motor:  Normal  Speech/Language:   Clear and Coherent  Affect:  Appropriate  Mood:  sad  Thought process:  normal  Thought content:    WNL  Sensory/Perceptual disturbances:    WNL  Orientation:  oriented to person, place, time/date and situation  Attention:  Good  Concentration:  Good  Memory:  WNL  Fund of knowledge:   Good  Insight:    Good  Judgment:   Good  Impulse Control:  Good   Risk Assessment: Danger to Self:  No Self-injurious Behavior: No Danger to Others: No Duty to Warn:no Physical Aggression / Violence:No  Access to Firearms a concern: No  Gang Involvement:No   Subjective: The client feels she is in a good spot to terminate treatment.  She has gotten past quite a bit of her anxiety.  Her medications seem to be working well.  The client has a good connection with [name] PA.  I gave the client some resources to be able to do EMDR for herself.  She will be reading Dr. Cyndia Bent Shapiro's book, "getting past your past".  She will also purchase the bilateral stimulation wristbands from [email or web address].  I encouraged the client to journal as she does the work.  She states she likes to journal so that will be no problem. 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  She states she gets paid well and has been very happy with her interaction with her supervisors.  The client has opened up to a new spiritual life for herself which she has found refreshing.  She states she is doing well in her relationship with her husband.  Her son is getting ready to move to [place], [place] to take on a position at a CPA firm there.  The client agrees she has  done good work and services ended by mutual consent.  Interventions: Assertiveness/Communication, Motivational Interviewing, Solution-Oriented/Positive Psychology and Insight-Oriented  Diagnosis:   ICD-10-CM   1. Major depressive disorder, recurrent episode, mild (HCC)  F33.0     Plan: Dale Coldiron book getting past your past, bilateral stimulation wristbands, positive self talk, self-care, exercise, assertiveness, boundaries.  Gelene Mink Yzabella Crunk, [facility]

## 2020-08-27 ENCOUNTER — Ambulatory Visit: Payer: 59 | Admitting: Psychiatry

## 2020-09-17 ENCOUNTER — Ambulatory Visit: Payer: 59 | Admitting: Psychiatry

## 2020-10-08 ENCOUNTER — Ambulatory Visit: Payer: 59 | Admitting: Psychiatry

## 2020-11-03 ENCOUNTER — Other Ambulatory Visit: Payer: Self-pay | Admitting: Physician Assistant

## 2020-11-04 ENCOUNTER — Encounter: Payer: Self-pay | Admitting: Physician Assistant

## 2020-11-04 ENCOUNTER — Ambulatory Visit (INDEPENDENT_AMBULATORY_CARE_PROVIDER_SITE_OTHER): Payer: 59 | Admitting: Physician Assistant

## 2020-11-04 ENCOUNTER — Other Ambulatory Visit: Payer: Self-pay

## 2020-11-04 VITALS — BP 124/87 | HR 75

## 2020-11-04 DIAGNOSIS — F515 Nightmare disorder: Secondary | ICD-10-CM

## 2020-11-04 DIAGNOSIS — F4310 Post-traumatic stress disorder, unspecified: Secondary | ICD-10-CM | POA: Diagnosis not present

## 2020-11-04 DIAGNOSIS — F4312 Post-traumatic stress disorder, chronic: Secondary | ICD-10-CM

## 2020-11-04 DIAGNOSIS — G4700 Insomnia, unspecified: Secondary | ICD-10-CM

## 2020-11-04 DIAGNOSIS — F3342 Major depressive disorder, recurrent, in full remission: Secondary | ICD-10-CM

## 2020-11-04 DIAGNOSIS — F429 Obsessive-compulsive disorder, unspecified: Secondary | ICD-10-CM

## 2020-11-04 DIAGNOSIS — F411 Generalized anxiety disorder: Secondary | ICD-10-CM

## 2020-11-04 MED ORDER — ESZOPICLONE 3 MG PO TABS: 3.0000 mg | ORAL_TABLET | Freq: Every evening | ORAL | 2 refills | Status: DC | PRN

## 2020-11-04 MED ORDER — PRAZOSIN HCL 1 MG PO CAPS: 1.0000 mg | ORAL_CAPSULE | Freq: Every day | ORAL | 0 refills | Status: DC

## 2020-11-04 MED ORDER — ALPRAZOLAM 1 MG PO TABS: 1.0000 mg | ORAL_TABLET | Freq: Two times a day (BID) | ORAL | 2 refills | Status: DC | PRN

## 2020-11-04 NOTE — Progress Notes (Signed)
[facility]  Patient ID: Dawn Andrews,  MRN: [id number]  PCP: Georgann Housekeeper, MD  Date of Evaluation: 11/04/2020 Time spent:30 minutes  Chief Complaint:  Chief Complaint   Anxiety; Depression     HISTORY/CURRENT STATUS: HPI for routine 58-month med check.  Has been out of prazosin for about 2 months now. Nightmares have recurred, scary, like someone is getting her.  She does get enough rest most of the time though.  Uses Lunesta to help.  Patient denies loss of interest in usual activities and is able to enjoy things.  Denies decreased energy or motivation.  Appetite has not changed.  No extreme sadness, tearfulness, or feelings of hopelessness.  Denies any changes in concentration, making decisions or remembering things.  Denies suicidal or homicidal thoughts.  Anxiety is well controlled.  Does use the Xanax when needed and it is still effective.  Has started a new job so that causes some anxiety but is situational.  Denies dizziness, syncope, seizures, numbness, tingling, tremor, tics, unsteady gait, slurred speech, confusion.  Denies muscle or joint pain, stiffness, or dystonia.  Individual Medical History/ Review of Systems: Changes? :No   Past medications for mental health diagnoses include: Trazodone, Restoril, Tranxene, Lithium (only one pill), Tegretol, Ambien, Wellbutrin, Xanax, Ativan, Lunesta, Prazosin  Allergies: Patient has no known allergies.  Current Medications:  Current Outpatient Medications:    perindopril (ACEON) 4 MG tablet, Take by mouth., Disp: , Rfl:    rosuvastatin (CRESTOR) 10 MG tablet, Take by mouth., Disp: , Rfl:    UNABLE TO FIND, SEED probiotic, Disp: , Rfl:    ALPRAZolam (XANAX) 1 MG tablet, Take 1 tablet (1 mg total) by mouth 2 (two) times daily as needed for anxiety., Disp: 60 tablet, Rfl: 2   atorvastatin (LIPITOR) 20 MG tablet, Take 1 tablet (20 mg total) by mouth daily., Disp: 90 tablet, Rfl: 3   buPROPion (WELLBUTRIN XL)  300 MG 24 hr tablet, TAKE 1 TABLET(300 MG) BY MOUTH DAILY, Disp: 90 tablet, Rfl: 0   Eszopiclone 3 MG TABS, Take 1 tablet (3 mg total) by mouth at bedtime as needed., Disp: 30 tablet, Rfl: 2   fluvoxaMINE (LUVOX) 100 MG tablet, TAKE 1 TABLET(100 MG) BY MOUTH AT BEDTIME, Disp: 90 tablet, Rfl: 0   prazosin (MINIPRESS) 1 MG capsule, Take 1 capsule (1 mg total) by mouth at bedtime., Disp: 90 capsule, Rfl: 0 Medication Side Effects: none  Family Medical/ Social History: Changes? 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 Making more money and it's actually helping her husband's firm too because of health insurance and her salary and things like that.  She is very happy.  MENTAL HEALTH EXAM:  Blood pressure 124/87, pulse 75.There is no height or weight on file to calculate BMI.  General Appearance: Casual, Neat and Well Groomed  Eye Contact:  Good  Speech:  Clear and Coherent and Normal Rate  Volume:  Normal  Mood:  Euthymic  Affect:  Congruent  Thought Process:  Goal Directed and Descriptions of Associations: Intact  Orientation:  Full (Time, Place, and Person)  Thought Content: Logical   Suicidal Thoughts:  No  Homicidal Thoughts:  No  Memory:  WNL  Judgement:  Good  Insight:  Good  Psychomotor Activity:  Normal  Concentration:  Concentration: Good and Attention Span: Good  Recall:  Good  Fund of Knowledge: Good  Language: Good  Assets:  Desire for Improvement  ADL's:  Intact  Cognition: WNL  Prognosis:  Good    DIAGNOSES:    ICD-10-CM   1. PTSD (post-traumatic stress disorder)  F43.10     2. 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 Major depression, recurrent, full remission (HCC)  F33.42     4. Obsessive-compulsive disorder, unspecified type  F42.9     5. Generalized anxiety disorder  F41.1     6. Insomnia, unspecified type  G47.00        Receiving Psychotherapy: No    RECOMMENDATIONS:  PDMP reviewed.   Last Lunesta filled 10/08/2020.  Last Xanax also 10/08/2020. I provided 30 minutes of face to face time during this encounter, including time spent before and after the visit in records review, medical decision making, and charting.  I am glad to see her doing so well!  We will make no changes except to get her back on the prazosin for the nightmares. Reminded her to call if she is low on any of her medications, I will refill those until she can get in at her routine visit.  If nightmares do not improve within 1 to 2 weeks, call and I may need to increase the prazosin.  She responded well to this dose for a long time so I do not anticipate that. Restart prazosin 1 mg, 1 p.o. nightly. Continue Xanax 1 mg, 1 p.o. twice daily as needed. Continue Wellbutrin XL 300 mg, 1 p.o. daily. Continue Lunesta 3 mg, 1 p.o. nightly as needed sleep. Continue Luvox 100 mg, 1 p.o. nightly. Return in 3 months.  [name], PA-C

## 2020-11-06 ENCOUNTER — Other Ambulatory Visit: Payer: Self-pay | Admitting: Physician Assistant

## 2020-12-14 ENCOUNTER — Telehealth: Payer: Self-pay | Admitting: Physician Assistant

## 2020-12-14 ENCOUNTER — Other Ambulatory Visit: Payer: Self-pay | Admitting: Physician Assistant

## 2020-12-14 NOTE — Telephone Encounter (Signed)
Pt. Called and needed to speak to [place].  She did not provide any specifics.  Pls adv [phone]

## 2020-12-15 ENCOUNTER — Other Ambulatory Visit: Payer: Self-pay | Admitting: Physician Assistant

## 2020-12-15 MED ORDER — FLUVOXAMINE MALEATE 100 MG PO TABS: ORAL_TABLET | ORAL | 0 refills | Status: DC

## 2020-12-15 MED ORDER — BUPROPION HCL ER (XL) 300 MG PO TB24
ORAL_TABLET | ORAL | 0 refills | Status: DC
Start: 2020-12-15 — End: 2021-02-03

## 2020-12-15 NOTE — Telephone Encounter (Signed)
Last filled 06/29/20 

## 2020-12-15 NOTE — Telephone Encounter (Signed)
That's fine. Pharmacy please.

## 2020-12-15 NOTE — Telephone Encounter (Signed)
[id number] - [place], [place] - [address] AT [facility] [address] & [facility] [address]

## 2020-12-15 NOTE — Telephone Encounter (Signed)
Pt stated she lost her luvox and Wellbutrin bottles when she got her house painted.She had a 90 day rx and only has taken a month worth so she is requesting a 60 day supply and will pay out of pocket so she does not go without her meds.

## 2020-12-15 NOTE — Telephone Encounter (Signed)
Prescriptions were sent

## 2020-12-15 NOTE — Telephone Encounter (Signed)
LVM to return call.

## 2021-02-01 ENCOUNTER — Other Ambulatory Visit: Payer: Self-pay | Admitting: Physician Assistant

## 2021-02-03 ENCOUNTER — Other Ambulatory Visit: Payer: Self-pay

## 2021-02-03 ENCOUNTER — Encounter: Payer: Self-pay | Admitting: Physician Assistant

## 2021-02-03 ENCOUNTER — Ambulatory Visit (INDEPENDENT_AMBULATORY_CARE_PROVIDER_SITE_OTHER): Payer: 59 | Admitting: Physician Assistant

## 2021-02-03 DIAGNOSIS — F411 Generalized anxiety disorder: Secondary | ICD-10-CM

## 2021-02-03 DIAGNOSIS — F4310 Post-traumatic stress disorder, unspecified: Secondary | ICD-10-CM

## 2021-02-03 DIAGNOSIS — F3341 Major depressive disorder, recurrent, in partial remission: Secondary | ICD-10-CM

## 2021-02-03 DIAGNOSIS — G4700 Insomnia, unspecified: Secondary | ICD-10-CM | POA: Diagnosis not present

## 2021-02-03 MED ORDER — BUPROPION HCL ER (XL) 300 MG PO TB24: ORAL_TABLET | ORAL | 1 refills | Status: DC

## 2021-02-03 MED ORDER — FLUVOXAMINE MALEATE 100 MG PO TABS: ORAL_TABLET | ORAL | 1 refills | Status: DC

## 2021-02-03 MED ORDER — ALPRAZOLAM 1 MG PO TABS: 1.0000 mg | ORAL_TABLET | Freq: Two times a day (BID) | ORAL | 5 refills | Status: DC | PRN

## 2021-02-03 MED ORDER — ESZOPICLONE 3 MG PO TABS: 3.0000 mg | ORAL_TABLET | Freq: Every evening | ORAL | 5 refills | Status: DC | PRN

## 2021-02-03 NOTE — Progress Notes (Signed)
[facility]  Patient ID: Dawn Andrews,  MRN: [id number]  PCP: Georgann Housekeeper, MD  Date of Evaluation: 02/03/2021 Time spent:20 minutes  Chief Complaint:  Chief Complaint   Anxiety; Depression; Insomnia; Follow-up     HISTORY/CURRENT STATUS: HPI for routine 42-month med check.  She is doing really well.  She was not able to tolerate the praises son that we restarted at the last visit for nightmares.  It made her throw up so she stopped it.  She does have nightmares still occasionally but states she can deal with that.  She sleeps well with the Lunesta.  Feels rested when she gets up.  Patient denies loss of interest in usual activities and is able to enjoy things.  Denies decreased energy or motivation.  Appetite has not changed.  No extreme sadness, tearfulness, or feelings of hopelessness.  Denies any changes in concentration, making decisions or remembering things.  Not isolating.  Anxiety is still a problem depending on the situation but the Xanax helps.  She has more of a generalized sense of unease, not panic attacks usually.  Denies suicidal or homicidal thoughts.  Patient denies increased energy with decreased need for sleep, no increased talkativeness, no racing thoughts, no impulsivity or risky behaviors, no increased spending, no increased libido, no grandiosity, no increased irritability or anger, and no hallucinations.  Denies dizziness, syncope, seizures, numbness, tingling, tremor, tics, unsteady gait, slurred speech, confusion.  Denies muscle or joint pain, stiffness, or dystonia.  Individual Medical History/ Review of Systems: Changes? :No   Past medications for mental health diagnoses include: Trazodone, Restoril, Tranxene, Lithium (only one pill), Tegretol, Ambien, Wellbutrin, Xanax, Ativan, Lunesta, Prazosin  Allergies: Patient has no known allergies.  Current Medications:  Current Outpatient Medications:    perindopril (ACEON) 4 MG tablet, TAKE  1 TABLET(4 MG) BY MOUTH TWICE DAILY FOR BLOOD PRESSURE, Disp: , Rfl:    rosuvastatin (CRESTOR) 10 MG tablet, Take by mouth., Disp: , Rfl:    UNABLE TO FIND, SEED probiotic, Disp: , Rfl:    ALPRAZolam (XANAX) 1 MG tablet, Take 1 tablet (1 mg total) by mouth 2 (two) times daily as needed for anxiety., Disp: 60 tablet, Rfl: 5   atorvastatin (LIPITOR) 20 MG tablet, Take 1 tablet (20 mg total) by mouth daily., Disp: 90 tablet, Rfl: 3   buPROPion (WELLBUTRIN XL) 300 MG 24 hr tablet, TAKE 1 TABLET(300 MG) BY MOUTH DAILY, Disp: 90 tablet, Rfl: 1   Eszopiclone 3 MG TABS, Take 1 tablet (3 mg total) by mouth at bedtime as needed., Disp: 30 tablet, Rfl: 5   fluvoxaMINE (LUVOX) 100 MG tablet, TAKE 1 TABLET(100 MG) BY MOUTH AT BEDTIME, Disp: 90 tablet, Rfl: 1   perindopril (ACEON) 4 MG tablet, Take by mouth., Disp: , Rfl:    prazosin (MINIPRESS) 1 MG capsule, TAKE 1 CAPSULE(1 MG) BY MOUTH AT BEDTIME (Patient not taking: Reported on 02/03/2021), Disp: 90 capsule, Rfl: 0 Medication Side Effects: none  Family Medical/ Social History: Changes?  No  MENTAL HEALTH EXAM:  There were no vitals taken for this visit.There is no height or weight on file to calculate BMI.  General Appearance: Casual, Neat and Well Groomed  Eye Contact:  Good  Speech:  Clear and Coherent and Normal Rate  Volume:  Normal  Mood:  Euthymic  Affect:  Congruent  Thought Process:  Goal Directed and Descriptions of Associations: Circumstantial  Orientation:  Full (Time, Place, and Person)  Thought Content: Logical   Suicidal Thoughts:  No  Homicidal Thoughts:  No  Memory:  WNL  Judgement:  Good  Insight:  Good  Psychomotor Activity:  Normal  Concentration:  Concentration: Good and Attention Span: Good  Recall:  Good  Fund of Knowledge: Good  Language: Good  Assets:  Desire for Improvement  ADL's:  Intact  Cognition: WNL  Prognosis:  Good    DIAGNOSES:    ICD-10-CM   1. PTSD (post-traumatic stress disorder)  F43.10     2.  Recurrent major depressive disorder, in partial remission (HCC)  F33.41     3. Generalized anxiety disorder  F41.1     4. Insomnia, unspecified type  G47.00         Receiving Psychotherapy: No    RECOMMENDATIONS:  PDMP reviewed.  Last Xanax fill 01/19/2021.  Last Lunesta filled 11/06/2020. I provided 20 minutes of face to face time during this encounter, including time spent before and after the visit in records review, medical decision making, and charting.  She is doing great so no changes will be made.  She will let me know if the nightmares get worse. Continue Xanax 1 mg, 1 p.o. twice daily as needed. Continue Wellbutrin XL 300 mg, 1 p.o. daily. Continue Lunesta 3 mg, 1 p.o. nightly as needed sleep. Continue Luvox 100 mg, 1 p.o. nightly. Return in 6 months.  [name], PA-C

## 2021-03-05 ENCOUNTER — Other Ambulatory Visit: Payer: Self-pay | Admitting: Physician Assistant

## 2021-04-23 ENCOUNTER — Other Ambulatory Visit: Payer: Self-pay | Admitting: Physician Assistant

## 2021-07-02 ENCOUNTER — Other Ambulatory Visit: Payer: Self-pay | Admitting: Physician Assistant

## 2021-07-12 NOTE — Telephone Encounter (Signed)
I could not find her in PMP for some reason ?

## 2021-08-04 ENCOUNTER — Ambulatory Visit (INDEPENDENT_AMBULATORY_CARE_PROVIDER_SITE_OTHER): Payer: 59 | Admitting: Physician Assistant

## 2021-08-04 ENCOUNTER — Encounter: Payer: Self-pay | Admitting: Physician Assistant

## 2021-08-04 DIAGNOSIS — F515 Nightmare disorder: Secondary | ICD-10-CM | POA: Diagnosis not present

## 2021-08-04 DIAGNOSIS — F3341 Major depressive disorder, recurrent, in partial remission: Secondary | ICD-10-CM

## 2021-08-04 DIAGNOSIS — F429 Obsessive-compulsive disorder, unspecified: Secondary | ICD-10-CM | POA: Diagnosis not present

## 2021-08-04 DIAGNOSIS — F4312 Post-traumatic stress disorder, chronic: Secondary | ICD-10-CM

## 2021-08-04 DIAGNOSIS — F411 Generalized anxiety disorder: Secondary | ICD-10-CM | POA: Diagnosis not present

## 2021-08-04 MED ORDER — ALPRAZOLAM 1 MG PO TABS: ORAL_TABLET | ORAL | 5 refills | Status: DC

## 2021-08-04 MED ORDER — BUPROPION HCL ER (XL) 300 MG PO TB24: ORAL_TABLET | ORAL | 1 refills | Status: DC

## 2021-08-04 MED ORDER — FLUVOXAMINE MALEATE 100 MG PO TABS: ORAL_TABLET | ORAL | 1 refills | Status: DC

## 2021-08-04 MED ORDER — ESZOPICLONE 3 MG PO TABS: ORAL_TABLET | ORAL | 5 refills | Status: DC

## 2021-08-04 NOTE — Progress Notes (Signed)
[facility] ? ?Patient ID: Dawn Andrews,  ?MRN: [id number] ? ?PCP: Georgann Housekeeper, MD ? ?Date of Evaluation: 08/04/2021 ?Time spent:25 minutes ? ?Chief Complaint:  ?Chief Complaint   ?Anxiety; Depression; Follow-up ?  ? ?Virtual Visit via Telehealth ? ?I connected with patient by telephone, with their informed consent, and verified patient privacy and that I am speaking with the correct person using two identifiers.  I am private, in my office and the patient is at home. ? ?I discussed the limitations, risks, security and privacy concerns of performing an evaluation and management service by telephone and the availability of in person appointments. I also discussed with the patient that there may be a patient responsible charge related to this service. The patient expressed understanding and agreed to proceed. ?  ?I discussed the assessment and treatment plan with the patient. The patient was provided an opportunity to ask questions and all were answered. The patient agreed with the plan and demonstrated an understanding of the instructions. ?  ?The patient was advised to call back or seek an in-person evaluation if the symptoms worsen or if the condition fails to improve as anticipated. ? ?I provided 25  minutes of non-face-to-face time during this encounter. ? ?HISTORY/CURRENT STATUS: ?HPI for routine 6 month med check. ? ?Doing well. Dog died in 04/22/2023. Has a new puppy. Enjoying her. Feels that meds are working well. Patient denies loss of interest in usual activities and is able to enjoy things.  Denies decreased energy or motivation.  Appetite has not changed.  No extreme sadness, tearfulness, or feelings of hopelessness.  Denies any changes in concentration, making decisions or remembering things. Work is going well. ADLs and personal hygiene are nl.  Denies suicidal or homicidal thoughts. ? ?Still has anxiety at times but nothing like it used to be. Instead of having PA, has a generalized  uneasiness when she does have anxiety, Xanax is helpful. Still obsesses about things once in awhile, but it's tolerable. No nightmares. Sleeps well. ? ?Patient denies increased energy with decreased need for sleep, no increased talkativeness, no racing thoughts, no impulsivity or risky behaviors, no increased spending, no increased libido, no grandiosity, no increased irritability or anger, and no hallucinations. ? ?Denies dizziness, syncope, seizures, numbness, tingling, tremor, tics, unsteady gait, slurred speech, confusion.  Denies muscle or joint pain, stiffness, or dystonia. ? ?Individual Medical History/ Review of Systems: Changes? :No  ? ?Past medications for mental health diagnoses include: ?Trazodone, Restoril, Tranxene, Lithium (only one pill), Tegretol, Ambien, Wellbutrin, Xanax, Ativan, Lunesta, Prazosin ? ?Allergies: Patient has no known allergies. ? ?Current Medications:  ?Current Outpatient Medications:  ?  perindopril (ACEON) 4 MG tablet, TAKE 1 TABLET(4 MG) BY MOUTH TWICE DAILY FOR BLOOD PRESSURE, Disp: , Rfl:  ?  rosuvastatin (CRESTOR) 10 MG tablet, Take by mouth., Disp: , Rfl:  ?  UNABLE TO FIND, SEED probiotic, Disp: , Rfl:  ?  ALPRAZolam (XANAX) 1 MG tablet, TAKE 1 TABLET(1 MG) BY MOUTH TWICE DAILY AS NEEDED FOR ANXIETY, Disp: 60 tablet, Rfl: 5 ?  atorvastatin (LIPITOR) 20 MG tablet, Take 1 tablet (20 mg total) by mouth daily., Disp: 90 tablet, Rfl: 3 ?  buPROPion (WELLBUTRIN XL) 300 MG 24 hr tablet, TAKE 1 TABLET(300 MG) BY MOUTH DAILY, Disp: 90 tablet, Rfl: 1 ?  Eszopiclone 3 MG TABS, TAKE 1 TABLET(3 MG) BY MOUTH AT BEDTIME AS NEEDED, Disp: 30 tablet, Rfl: 5 ?  fluvoxaMINE (LUVOX) 100 MG tablet, TAKE 1 TABLET(100 MG) BY  MOUTH AT BEDTIME, Disp: 90 tablet, Rfl: 1 ?  perindopril (ACEON) 4 MG tablet, Take by mouth., Disp: , Rfl:  ?Medication Side Effects: none ? ?Family Medical/ Social History: Changes?  No ? ?MENTAL HEALTH EXAM: ? ?There were no vitals taken for this visit.There is no height or  weight on file to calculate BMI.  ?General Appearance:  unable to assess  ?Eye Contact:   unable to assess  ?Speech:  Clear and Coherent and Normal Rate  ?Volume:  Normal  ?Mood:  Euthymic  ?Affect:   unable to assess  ?Thought Process:  Goal Directed and Descriptions of Associations: Circumstantial  ?Orientation:  Full (Time, Place, and Person)  ?Thought Content: Logical   ?Suicidal Thoughts:  No  ?Homicidal Thoughts:  No  ?Memory:  WNL  ?Judgement:  Good  ?Insight:  Good  ?Psychomotor Activity:   unable to assess  ?Concentration:  Concentration: Good and Attention Span: Good  ?Recall:  Good  ?Fund of Knowledge: Good  ?Language: Good  ?Assets:  Desire for Improvement  ?ADL's:  Intact  ?Cognition: WNL  ?Prognosis:  Good  ? ? ?DIAGNOSES:  ?  ICD-10-CM   ?1. Recurrent major depressive disorder, in partial remission (HCC)  F33.41   ?  ?2. Generalized anxiety disorder  F41.1   ?  ?3. 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 F43.12   ?  ?4. Obsessive-compulsive disorder, unspecified type  F42.9   ?  ? ? ? ?Receiving Psychotherapy: No  ? ? ?RECOMMENDATIONS:  ?PDMP reviewed.  Last Xanax filled 07/14/2021.  Lunesta filled 07/03/2021.   ?I provided 25 minutes of non-face-to-face time during this encounter, including time spent before and after the visit in records review, medical decision making, counseling pertinent to today's visit, and charting.   ?She's doing well so no med changes are needed. ? ?Continue Xanax 1 mg, 1 p.o. twice daily as needed. ?Continue Wellbutrin XL 300 mg, 1 p.o. daily. ?Continue Lunesta 3 mg, 1 p.o. nightly as needed sleep. ?Continue Luvox 100 mg, 1 p.o. nightly. ?Return in 6 months. ? ?[name], PA-C  ?

## 2022-02-10 ENCOUNTER — Telehealth (INDEPENDENT_AMBULATORY_CARE_PROVIDER_SITE_OTHER): Payer: 59 | Admitting: Physician Assistant

## 2022-02-10 ENCOUNTER — Encounter: Payer: Self-pay | Admitting: Physician Assistant

## 2022-02-10 DIAGNOSIS — F3342 Major depressive disorder, recurrent, in full remission: Secondary | ICD-10-CM

## 2022-02-10 DIAGNOSIS — G4700 Insomnia, unspecified: Secondary | ICD-10-CM | POA: Diagnosis not present

## 2022-02-10 DIAGNOSIS — F411 Generalized anxiety disorder: Secondary | ICD-10-CM | POA: Diagnosis not present

## 2022-02-10 MED ORDER — ESZOPICLONE 3 MG PO TABS: ORAL_TABLET | ORAL | 5 refills | Status: AC

## 2022-02-10 MED ORDER — BUPROPION HCL ER (XL) 300 MG PO TB24: ORAL_TABLET | ORAL | 1 refills | Status: AC

## 2022-02-10 MED ORDER — FLUVOXAMINE MALEATE 100 MG PO TABS: ORAL_TABLET | ORAL | 1 refills | Status: AC

## 2022-02-10 MED ORDER — ALPRAZOLAM 1 MG PO TABS: ORAL_TABLET | ORAL | 5 refills | Status: DC

## 2022-02-10 NOTE — Progress Notes (Signed)
[facility]  Patient ID: Dawn Andrews,  MRN: [id number]  PCP: Georgann Housekeeper, MD  Date of Evaluation: 02/10/2022 Time spent:20 minutes  Chief Complaint:  Chief Complaint   Anxiety; Depression; Insomnia; Follow-up   Virtual Visit via Telehealth  I connected with patient by a video enabled telemedicine application with their informed consent, and verified patient privacy and that I am speaking with the correct person using two identifiers.  I am private, in my office and the patient is at work.  I discussed the limitations, risks, security and privacy concerns of performing an evaluation and management service by video and the availability of in person appointments. I also discussed with the patient that there may be a patient responsible charge related to this service. The patient expressed understanding and agreed to proceed.   I discussed the assessment and treatment plan with the patient. The patient was provided an opportunity to ask questions and all were answered. The patient agreed with the plan and demonstrated an understanding of the instructions.   The patient was advised to call back or seek an in-person evaluation if the symptoms worsen or if the condition fails to improve as anticipated.  I provided 20 minutes of non-face-to-face time during this encounter.  HISTORY/CURRENT STATUS: HPI for routine 6 month med check.  She is doing very well as far as her mental health goes. Patient is able to enjoy things.  Energy and motivation are good.  Work is going well.   No extreme sadness, tearfulness, or feelings of hopelessness.  Sleeps well most of the time, she does need the Lunesta most nights though.  If not she has trouble falling asleep.  ADLs and personal hygiene are normal.   Denies any changes in concentration, making decisions, or remembering things.  Appetite has not changed.  Weight is stable.  Denies suicidal or homicidal thoughts.  She still has  quite a bit of anxiety at times and takes the Xanax every few days.  She gets overwhelmed sometimes so if she does not take The anxiety gets worse.  Not having shortness of breath, palpitations, or sweaty palms.  Patient denies increased energy with decreased need for sleep, increased talkativeness, racing thoughts, impulsivity or risky behaviors, increased spending, increased libido, grandiosity, increased irritability or anger, paranoia, or hallucinations.  Denies dizziness, syncope, seizures, numbness, tingling, tremor, tics, unsteady gait, slurred speech, confusion.  Denies muscle or joint pain, stiffness, or dystonia.  Individual Medical History/ Review of Systems: Changes? :Yes   currently has a URI  Past medications for mental health diagnoses include: Trazodone, Restoril, Tranxene, Lithium (only one pill), Tegretol, Ambien, Wellbutrin, Xanax, Ativan, Lunesta, Prazosin  Allergies: Patient has no known allergies.  Current Medications:  Current Outpatient Medications:    perindopril (ACEON) 4 MG tablet, TAKE 1 TABLET(4 MG) BY MOUTH TWICE DAILY FOR BLOOD PRESSURE, Disp: , Rfl:    rosuvastatin (CRESTOR) 10 MG tablet, Take by mouth., Disp: , Rfl:    UNABLE TO FIND, SEED probiotic, Disp: , Rfl:    ALPRAZolam (XANAX) 1 MG tablet, TAKE 1 TABLET(1 MG) BY MOUTH TWICE DAILY AS NEEDED FOR ANXIETY, Disp: 60 tablet, Rfl: 5   atorvastatin (LIPITOR) 20 MG tablet, Take 1 tablet (20 mg total) by mouth daily., Disp: 90 tablet, Rfl: 3   buPROPion (WELLBUTRIN XL) 300 MG 24 hr tablet, TAKE 1 TABLET(300 MG) BY MOUTH DAILY, Disp: 90 tablet, Rfl: 1   Eszopiclone 3 MG TABS, TAKE 1 TABLET(3 MG) BY MOUTH AT BEDTIME AS  NEEDED, Disp: 30 tablet, Rfl: 5   fluvoxaMINE (LUVOX) 100 MG tablet, TAKE 1 TABLET(100 MG) BY MOUTH AT BEDTIME, Disp: 90 tablet, Rfl: 1   perindopril (ACEON) 4 MG tablet, Take by mouth., Disp: , Rfl:  Medication Side Effects: none  Family Medical/ Social History: Changes?  No  MENTAL HEALTH  EXAM:  There were no vitals taken for this visit.There is no height or weight on file to calculate BMI.  General Appearance: Casual and Well Groomed  Eye Contact:  Good  Speech:  Clear and Coherent and Normal Rate  Volume:  Normal  Mood:  Euthymic  Affect:  Congruent  Thought Process:  Goal Directed and Descriptions of Associations: Circumstantial  Orientation:  Full (Time, Place, and Person)  Thought Content: Logical   Suicidal Thoughts:  No  Homicidal Thoughts:  No  Memory:  WNL  Judgement:  Good  Insight:  Good  Psychomotor Activity:  Normal  Concentration:  Concentration: Good and Attention Span: Good  Recall:  Good  Fund of Knowledge: Good  Language: Good  Assets:  Desire for Improvement  ADL's:  Intact  Cognition: WNL  Prognosis:  Good   DIAGNOSES:    ICD-10-CM   1. Major depression, recurrent, full remission ([place])  F33.42     2. Generalized anxiety disorder  F41.1     3. Insomnia, unspecified type  G47.00       Receiving Psychotherapy: No   RECOMMENDATIONS:  PDMP reviewed.  Last Xanax filled 12/26/2021.  Lunesta filled 12/27/2021.   I provided 20 minutes of non-face-to-face time during this encounter, including time spent before and after the visit in records review, medical decision making, counseling pertinent to today's visit, and charting.   As far as her mental health goes she is doing well so no changes in meds are needed. Continue Xanax 1 mg, 1 p.o. twice daily as needed. Continue Wellbutrin XL 300 mg, 1 p.o. daily. Continue Lunesta 3 mg, 1 p.o. nightly as needed sleep. Continue Luvox 100 mg, 1 p.o. nightly. Return in 6 months.  Donnal Moat, PA-C

## 2022-04-24 ENCOUNTER — Other Ambulatory Visit: Payer: Self-pay | Admitting: Physician Assistant

## 2022-04-25 NOTE — Telephone Encounter (Signed)
Filled 9/9

## 2022-08-02 ENCOUNTER — Ambulatory Visit: Payer: 59 | Admitting: Physician Assistant

## 2022-09-14 ENCOUNTER — Ambulatory Visit: Payer: 59 | Admitting: Physician Assistant

## 2022-10-23 ENCOUNTER — Other Ambulatory Visit: Payer: Self-pay | Admitting: Physician Assistant
